# Patient Record
Sex: Female | Born: 1995 | Race: Black or African American | Hispanic: No | Marital: Single | Smoking: Never smoker
Health system: Southern US, Community
[De-identification: ages and names within clinical notes are randomized; demographics above are authoritative.]

## PROBLEM LIST (undated history)

## (undated) DIAGNOSIS — F419 Anxiety disorder, unspecified: Secondary | ICD-10-CM

## (undated) DIAGNOSIS — F32A Depression, unspecified: Secondary | ICD-10-CM

## (undated) DIAGNOSIS — F329 Major depressive disorder, single episode, unspecified: Secondary | ICD-10-CM

## (undated) DIAGNOSIS — Z789 Other specified health status: Secondary | ICD-10-CM

---

## 2016-09-18 ENCOUNTER — Emergency Department (HOSPITAL_COMMUNITY)
Admission: EM | Admit: 2016-09-18 | Discharge: 2016-09-19 | Disposition: A | Discharge status: Other Acute Inpatient Hospital | Attending: Emergency Medicine | Admitting: Emergency Medicine

## 2016-09-18 ENCOUNTER — Encounter (HOSPITAL_COMMUNITY): Payer: Self-pay | Admitting: Emergency Medicine

## 2016-09-18 DIAGNOSIS — Y939 Activity, unspecified: Secondary | ICD-10-CM | POA: Insufficient documentation

## 2016-09-18 DIAGNOSIS — Z5181 Encounter for therapeutic drug level monitoring: Secondary | ICD-10-CM | POA: Insufficient documentation

## 2016-09-18 DIAGNOSIS — R45851 Suicidal ideations: Secondary | ICD-10-CM

## 2016-09-18 DIAGNOSIS — Y929 Unspecified place or not applicable: Secondary | ICD-10-CM | POA: Insufficient documentation

## 2016-09-18 DIAGNOSIS — S80812A Abrasion, left lower leg, initial encounter: Secondary | ICD-10-CM | POA: Insufficient documentation

## 2016-09-18 DIAGNOSIS — F411 Generalized anxiety disorder: Secondary | ICD-10-CM

## 2016-09-18 DIAGNOSIS — S80811A Abrasion, right lower leg, initial encounter: Secondary | ICD-10-CM | POA: Insufficient documentation

## 2016-09-18 DIAGNOSIS — Y999 Unspecified external cause status: Secondary | ICD-10-CM | POA: Insufficient documentation

## 2016-09-18 DIAGNOSIS — Z23 Encounter for immunization: Secondary | ICD-10-CM | POA: Insufficient documentation

## 2016-09-18 DIAGNOSIS — S60511A Abrasion of right hand, initial encounter: Secondary | ICD-10-CM | POA: Insufficient documentation

## 2016-09-18 DIAGNOSIS — Z7289 Other problems related to lifestyle: Secondary | ICD-10-CM

## 2016-09-18 DIAGNOSIS — T07XXXA Unspecified multiple injuries, initial encounter: Secondary | ICD-10-CM

## 2016-09-18 DIAGNOSIS — F322 Major depressive disorder, single episode, severe without psychotic features: Secondary | ICD-10-CM | POA: Diagnosis present

## 2016-09-18 DIAGNOSIS — S60512A Abrasion of left hand, initial encounter: Secondary | ICD-10-CM | POA: Insufficient documentation

## 2016-09-18 DIAGNOSIS — F419 Anxiety disorder, unspecified: Secondary | ICD-10-CM | POA: Insufficient documentation

## 2016-09-18 DIAGNOSIS — X788XXA Intentional self-harm by other sharp object, initial encounter: Secondary | ICD-10-CM | POA: Insufficient documentation

## 2016-09-18 LAB — RAPID URINE DRUG SCREEN, HOSP PERFORMED
Amphetamines: NOT DETECTED
BENZODIAZEPINES: NOT DETECTED
Barbiturates: NOT DETECTED
COCAINE: NOT DETECTED
OPIATES: NOT DETECTED
Tetrahydrocannabinol: NOT DETECTED

## 2016-09-18 LAB — CBC
HCT: 37.6 % (ref 36.0–46.0)
Hemoglobin: 13.3 g/dL (ref 12.0–15.0)
MCH: 32 pg (ref 26.0–34.0)
MCHC: 35.4 g/dL (ref 30.0–36.0)
MCV: 90.4 fL (ref 78.0–100.0)
PLATELETS: 294 10*3/uL (ref 150–400)
RBC: 4.16 MIL/uL (ref 3.87–5.11)
RDW: 12.6 % (ref 11.5–15.5)
WBC: 11.4 10*3/uL — AB (ref 4.0–10.5)

## 2016-09-18 LAB — COMPREHENSIVE METABOLIC PANEL
ALBUMIN: 4.7 g/dL (ref 3.5–5.0)
ALK PHOS: 79 U/L (ref 38–126)
ALT: 20 U/L (ref 14–54)
ANION GAP: 10 (ref 5–15)
AST: 26 U/L (ref 15–41)
BILIRUBIN TOTAL: 0.7 mg/dL (ref 0.3–1.2)
BUN: 10 mg/dL (ref 6–20)
CALCIUM: 9.8 mg/dL (ref 8.9–10.3)
CO2: 25 mmol/L (ref 22–32)
Chloride: 106 mmol/L (ref 101–111)
Creatinine, Ser: 0.64 mg/dL (ref 0.44–1.00)
GFR calc non Af Amer: >60 mL/min (ref >60)
Glucose, Bld: 84 mg/dL (ref 65–99)
POTASSIUM: 3.7 mmol/L (ref 3.5–5.1)
SODIUM: 141 mmol/L (ref 135–145)
TOTAL PROTEIN: 7.9 g/dL (ref 6.5–8.1)

## 2016-09-18 LAB — I-STAT BETA HCG BLOOD, ED (MC, WL, AP ONLY): I-stat hCG, quantitative: <5 m[IU]/mL (ref <5)

## 2016-09-18 LAB — ACETAMINOPHEN LEVEL

## 2016-09-18 LAB — SALICYLATE LEVEL

## 2016-09-18 LAB — ETHANOL: Alcohol, Ethyl (B): <5 mg/dL (ref <5)

## 2016-09-18 MED ORDER — ACETAMINOPHEN 325 MG PO TABS: 650.0000 mg | ORAL_TABLET | ORAL | Status: DC | PRN

## 2016-09-18 MED ORDER — ONDANSETRON HCL 4 MG PO TABS: 4.0000 mg | ORAL_TABLET | Freq: Three times a day (TID) | ORAL | Status: DC | PRN

## 2016-09-18 MED ORDER — ALUM & MAG HYDROXIDE-SIMETH 200-200-20 MG/5ML PO SUSP: 30.0000 mL | ORAL | Status: DC | PRN

## 2016-09-18 MED ORDER — ZOLPIDEM TARTRATE 5 MG PO TABS
5.0000 mg | ORAL_TABLET | Freq: Every evening | ORAL | Status: DC | PRN
  Administered 2016-09-18: 5 mg via ORAL
  Filled 2016-09-18: qty 1

## 2016-09-18 MED ORDER — BACITRACIN ZINC 500 UNIT/GM EX OINT
TOPICAL_OINTMENT | CUTANEOUS | Status: DC | PRN
  Administered 2016-09-18: 1 via TOPICAL
  Filled 2016-09-18: qty 28.35

## 2016-09-18 MED ORDER — TETANUS-DIPHTH-ACELL PERTUSSIS 5-2.5-18.5 LF-MCG/0.5 IM SUSP
0.5000 mL | Freq: Once | INTRAMUSCULAR | Status: DC
  Filled 2016-09-18: qty 0.5

## 2016-09-18 MED ORDER — LORAZEPAM 1 MG PO TABS
1.0000 mg | ORAL_TABLET | Freq: Three times a day (TID) | ORAL | Status: DC | PRN
  Administered 2016-09-18: 1 mg via ORAL
  Filled 2016-09-18: qty 1

## 2016-09-18 MED ORDER — LORAZEPAM 1 MG PO TABS: 1.0000 mg | ORAL_TABLET | Freq: Once | ORAL | Status: DC

## 2016-09-18 MED ORDER — IBUPROFEN 200 MG PO TABS: 600.0000 mg | ORAL_TABLET | Freq: Three times a day (TID) | ORAL | Status: DC | PRN

## 2016-09-18 NOTE — ED Provider Notes (Signed)
[facility]-EMERGENCY DEPT Provider Note   CSN: [id number] Arrival date & time: 09/18/16  1809     History   Chief Complaint Chief Complaint  Patient presents with  . Suicidal    HPI Carrie Fischer is a 20 y.o. otherwise healthy female, who presents to the ED with complaints of worsening anxiety and suicidal ideations with a plan of slitting her wrists with a razor. Patient admits that she self cuts usually to help soothe her anxieties/etc, but last night she states she did it in an attempt to kill herself. She reports that the counselor at [facility] today advised her to come here for ongoing psychiatric care. She states that she's been very anxious since coming to college, and school seems to have aggravated her psychiatric issues. She denies HI/AVH, alcohol use, drug use, or smoking. She has no medical problems, takes no medications, and has no known drug allergies. She is not sure when her last tetanus was. Denies any erythema, warmth, swelling, or drainage from the wounds. Denies any other medical complaints at this time. Here voluntarily. LMP 3 days ago.   The history is provided by the patient and medical records. No language interpreter was used.  Mental Health Problem  Presenting symptoms: depression, self-mutilation, suicidal thoughts and suicide attempt   Presenting symptoms: no hallucinations and no homicidal ideas   Onset quality:  Gradual Timing:  Constant Progression:  Worsening Chronicity:  Recurrent Context: stressful life event (school)   Treatment compliance:  Untreated Relieved by:  None tried Exacerbated by: school. Ineffective treatments:  None tried Associated symptoms: anxiety   Associated symptoms: no abdominal pain and no chest pain     History reviewed. No pertinent past medical history.  There are no active problems to display for this patient.   History reviewed. No pertinent surgical history.  OB History    No data available       Home Medications      Prior to Admission medications   Not on File    Family History History reviewed. No pertinent family history.  Social History Social History  Substance Use Topics  . Smoking status: Never Smoker  . Smokeless tobacco: Never Used  . Alcohol use No     Allergies   Patient has no known allergies.   Review of Systems Review of Systems  Constitutional: Negative for chills and fever.  Respiratory: Negative for shortness of breath.   Cardiovascular: Negative for chest pain.  Gastrointestinal: Negative for abdominal pain, constipation, diarrhea, nausea and vomiting.  Genitourinary: Negative for dysuria and hematuria.  Musculoskeletal: Negative for arthralgias, joint swelling and myalgias.  Skin: Positive for wound (self-cuts arms and legs, no warmth/erythema/swelling/drainage). Negative for color change.  Allergic/Immunologic: Negative for immunocompromised state.  Neurological: Negative for weakness and numbness.  Psychiatric/Behavioral: Positive for self-injury and suicidal ideas. Negative for confusion, hallucinations and homicidal ideas. The patient is nervous/anxious.    10 Systems reviewed and are negative for acute change except as noted in the HPI.   Physical Exam Updated Vital Signs BP 126/86 (BP Location: Left Arm)   Pulse 95   Temp 97.5 F (36.4 C) (Oral)   Resp 16   SpO2 97%   Physical Exam  Constitutional: She is oriented to person, place, and time. Vital signs are normal. She appears well-developed and well-nourished.  Non-toxic appearance. No distress.  Afebrile, nontoxic, NAD  HENT:  Head: Normocephalic and atraumatic.  Mouth/Throat: Oropharynx is clear and moist and mucous membranes are normal.  Eyes: Conjunctivae  and EOM are normal. Right eye exhibits no discharge. Left eye exhibits no discharge.  Neck: Normal range of motion. Neck supple.  Cardiovascular: Normal rate, regular rhythm, normal heart sounds and intact distal pulses.  Exam reveals no  gallop and no friction rub.   No murmur heard. Pulmonary/Chest: Effort normal and breath sounds normal. No respiratory distress. She has no decreased breath sounds. She has no wheezes. She has no rhonchi. She has no rales.  Abdominal: Soft. Normal appearance and bowel sounds are normal. She exhibits no distension. There is no tenderness. There is no rigidity, no rebound, no guarding, no CVA tenderness, no tenderness at McBurney's point and negative Murphy's sign.  Musculoskeletal: Normal range of motion.  Neurological: She is alert and oriented to person, place, and time. She has normal strength. No sensory deficit.  Skin: Skin is warm and dry. Abrasion noted. No rash noted. No erythema.  Multiple linear abrasions to b/l upper and lower extremities, no ongoing bleeding, no surrounding cellulitis, no drainage/warmth/erythema.   Psychiatric: Her speech is normal and behavior is normal. She is not actively hallucinating. She exhibits a depressed mood. She expresses suicidal ideation. She expresses no homicidal ideation. She expresses suicidal plans. She expresses no homicidal plans.  Depressed affect, endorsing SI with plan, denies AVH/HI  Nursing note and vitals reviewed.    ED Treatments / Results  Labs (all labs ordered are listed, but only abnormal results are displayed) Labs Reviewed  ACETAMINOPHEN LEVEL - Abnormal; Notable for the following:       Result Value   Acetaminophen (Tylenol), Serum <10 (*)    All other components within normal limits  CBC - Abnormal; Notable for the following:    WBC 11.4 (*)    All other components within normal limits  COMPREHENSIVE METABOLIC PANEL  ETHANOL  SALICYLATE LEVEL  RAPID URINE DRUG SCREEN, HOSP PERFORMED  I-STAT BETA HCG BLOOD, ED ([facility], [facility], [facility] ONLY)    EKG  EKG Interpretation None       Radiology No results found.  Procedures Procedures (including critical care time)  Medications Ordered in ED Medications  Tdap (BOOSTRIX)  injection 0.5 mL (not administered)  alum & mag hydroxide-simeth (MAALOX/MYLANTA) 200-200-20 MG/5ML suspension 30 mL (not administered)  ondansetron (ZOFRAN) tablet 4 mg (not administered)  zolpidem (AMBIEN) tablet 5 mg (not administered)  ibuprofen (ADVIL,MOTRIN) tablet 600 mg (not administered)  acetaminophen (TYLENOL) tablet 650 mg (not administered)  LORazepam (ATIVAN) tablet 1 mg (not administered)     Initial Impression / Assessment and Plan / ED Course  I have reviewed the triage vital signs and the nursing notes.  Pertinent labs & imaging results that were available during my care of the patient were reviewed by me and considered in my medical decision making (see chart for details).  Clinical Course     20 y.o. female here with SI with a plan to cut herself, self-harms by cutting but states that last night it was more in a suicide attempt. Multiple abrasions on arms and legs bilaterally, no evidence of superimposed infection, no open or bleeding wounds, all very superficial and not requiring further emergent intervention. Will update Tdap. Denies other complaints, nonsmoker, no EtOH/Drug use, denies HI/AVH. Will get screening labs and then TTS consult. So far, UDS unremarkable and CBC with mildly elevated WBC 11.4 but otherwise unremarkable. Awaiting remaining labs.   7:59 PM EtOH undetectable. CMP WNL. Salicylate and acetaminophen levels WNL. HCG neg. Pt medically cleared at this time. Psych hold orders  and home med orders placed. Please see TTS notes for further documentation of care/dispo. PLEASE NOTE THAT PT IS HERE VOLUNTARILY AT THIS TIME, IF PT TRIES TO LEAVE THEY WOULD NEED IVC PAPERWORK TAKEN OUT. Pt stable at time of med clearance.    Final Clinical Impressions(s) / ED Diagnoses   Final diagnoses:  Suicidal ideation  Anxiety  Deliberate self-cutting  Abrasions of multiple sites    New Prescriptions New Prescriptions   No medications on file     Allen DerryMercedes  Camprubi-Soms, PA-C 09/18/16 1959    Linwood DibblesJon Knapp, MD 09/20/16 1610
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
## 2016-09-18 NOTE — ED Notes (Signed)
Bed: WA31 Expected date:  Expected time:  Means of arrival:  Comments: Hold for triage 4 

## 2016-09-18 NOTE — ED Triage Notes (Signed)
Pt brought by campus counseling for suicidal ideation with plan for cutting self, depression, anxiety, with suicide attempt today at 0300 by cutting self. Many shallow, long, crusted excoriations to bilateral arms and legs. No bleeding, swelling, erythema, heat, drainage. Treated for depression in past. No HI/AVH.

## 2016-09-18 NOTE — BH Assessment (Addendum)
TTS interview has been completed. C Pt became observantly anxious during assessment. Pt began shaking and muscles became tense. Clinician suspected panic attack onset. Clinician engage pt in series of breathing and relaxation interventions. Pt anxiety level decreased and pt verbalized that the hospital was traumatic to her and she is unwilling to remain in ED. Clinician provided supportive counseling and psycho education.   Clinician advised Marchelle FolksAmanda, RN of pt presentation.   Clinician consulted with Donell SievertSpencer Simon, PA and pt is recommended for inpatient admission. Clinician informed pt RN and EDP Dr.Knapp of pt disposition. Clinician also communicated IVC recommendation.  Clinician confirmed lack of [facility] bed availability with Clint Bolderori Beck, AC. TTS to seek placement.

## 2016-09-18 NOTE — ED Notes (Signed)
Patient is A&O x4. Patient presents depressed. Patient states that she cuts herself and that she was having suicidal thoughts. 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 Patient currently denies SI/HI and AVH. Patient has bilateral cuts all over her lower arms and legs. MD notified for bacitracin order and applied to patients wounds. Patient rates anxiety as 8/10. Safety check performed. Patient placed on Q15 minute safety checks. Will continue to monitor.

## 2016-09-18 NOTE — ED Notes (Addendum)
Patient resting. Patient states anxiety is now 0/10. Will continue to monitor.

## 2016-09-18 NOTE — ED Notes (Signed)
TTS reported patient became very anxious during consult. Patient made phone call and became anxious on the phone. PRN ativan given as prescribed. Patient requested something for sleep saying "I can't sleep here". PRN Ambien given as prescribed. Will continue to monitor and reassess.

## 2016-09-19 ENCOUNTER — Encounter (HOSPITAL_COMMUNITY): Payer: Self-pay

## 2016-09-19 ENCOUNTER — Inpatient Hospital Stay (HOSPITAL_COMMUNITY)
Admission: AD | Admit: 2016-09-19 | Discharge: 2016-09-24 | DRG: 885 | Disposition: A | Discharge status: Home / Self Care | Source: Intra-hospital | Attending: Psychiatry | Admitting: Psychiatry

## 2016-09-19 DIAGNOSIS — G4700 Insomnia, unspecified: Secondary | ICD-10-CM | POA: Diagnosis present

## 2016-09-19 DIAGNOSIS — F410 Panic disorder [episodic paroxysmal anxiety] without agoraphobia: Secondary | ICD-10-CM | POA: Diagnosis present

## 2016-09-19 DIAGNOSIS — F332 Major depressive disorder, recurrent severe without psychotic features: Secondary | ICD-10-CM | POA: Diagnosis present

## 2016-09-19 DIAGNOSIS — F411 Generalized anxiety disorder: Secondary | ICD-10-CM | POA: Diagnosis present

## 2016-09-19 DIAGNOSIS — Z915 Personal history of self-harm: Secondary | ICD-10-CM | POA: Diagnosis not present

## 2016-09-19 DIAGNOSIS — S40819A Abrasion of unspecified upper arm, initial encounter: Secondary | ICD-10-CM | POA: Diagnosis present

## 2016-09-19 DIAGNOSIS — R45851 Suicidal ideations: Secondary | ICD-10-CM | POA: Diagnosis not present

## 2016-09-19 DIAGNOSIS — Z23 Encounter for immunization: Secondary | ICD-10-CM | POA: Diagnosis not present

## 2016-09-19 DIAGNOSIS — F322 Major depressive disorder, single episode, severe without psychotic features: Secondary | ICD-10-CM | POA: Diagnosis present

## 2016-09-19 DIAGNOSIS — F4000 Agoraphobia, unspecified: Secondary | ICD-10-CM | POA: Diagnosis present

## 2016-09-19 DIAGNOSIS — F4010 Social phobia, unspecified: Secondary | ICD-10-CM | POA: Diagnosis present

## 2016-09-19 DIAGNOSIS — X789XXA Intentional self-harm by unspecified sharp object, initial encounter: Secondary | ICD-10-CM | POA: Diagnosis present

## 2016-09-19 DIAGNOSIS — Z79899 Other long term (current) drug therapy: Secondary | ICD-10-CM | POA: Diagnosis not present

## 2016-09-19 DIAGNOSIS — Z818 Family history of other mental and behavioral disorders: Secondary | ICD-10-CM

## 2016-09-19 DIAGNOSIS — R12 Heartburn: Secondary | ICD-10-CM | POA: Diagnosis present

## 2016-09-19 HISTORY — DX: Other specified health status: Z78.9

## 2016-09-19 MED ORDER — FLUOXETINE HCL 10 MG PO CAPS
10.0000 mg | ORAL_CAPSULE | Freq: Every day | ORAL | Status: DC
Start: 2016-09-19 — End: 2016-09-19
  Administered 2016-09-19: 10 mg via ORAL
  Filled 2016-09-19: qty 1

## 2016-09-19 MED ORDER — ACETAMINOPHEN 325 MG PO TABS
650.0000 mg | ORAL_TABLET | ORAL | Status: DC | PRN
Start: 2016-09-19 — End: 2016-09-24

## 2016-09-19 MED ORDER — MAGNESIUM HYDROXIDE 400 MG/5ML PO SUSP: 30.0000 mL | Freq: Every day | ORAL | Status: DC | PRN

## 2016-09-19 MED ORDER — IBUPROFEN 600 MG PO TABS: 600.0000 mg | ORAL_TABLET | Freq: Three times a day (TID) | ORAL | Status: DC | PRN

## 2016-09-19 MED ORDER — ONDANSETRON HCL 4 MG PO TABS: 4.0000 mg | ORAL_TABLET | Freq: Three times a day (TID) | ORAL | Status: DC | PRN

## 2016-09-19 MED ORDER — FLUOXETINE HCL 10 MG PO CAPS
10.0000 mg | ORAL_CAPSULE | Freq: Every day | ORAL | Status: DC
  Administered 2016-09-20 – 2016-09-21 (×2): 10 mg via ORAL
  Filled 2016-09-19 (×4): qty 1

## 2016-09-19 MED ORDER — HYDROXYZINE HCL 25 MG PO TABS: 25.0000 mg | ORAL_TABLET | Freq: Three times a day (TID) | ORAL | Status: DC | PRN

## 2016-09-19 MED ORDER — ALUM & MAG HYDROXIDE-SIMETH 200-200-20 MG/5ML PO SUSP: 30.0000 mL | ORAL | Status: DC | PRN

## 2016-09-19 MED ORDER — INFLUENZA VAC SPLIT QUAD 0.5 ML IM SUSY
0.5000 mL | PREFILLED_SYRINGE | INTRAMUSCULAR | Status: AC
  Administered 2016-09-20: 0.5 mL via INTRAMUSCULAR
  Filled 2016-09-19: qty 0.5

## 2016-09-19 MED ORDER — HYDROXYZINE HCL 25 MG PO TABS
25.0000 mg | ORAL_TABLET | Freq: Three times a day (TID) | ORAL | Status: DC | PRN
Start: 2016-09-19 — End: 2016-09-20
  Administered 2016-09-19 – 2016-09-20 (×2): 25 mg via ORAL
  Filled 2016-09-19 (×2): qty 1

## 2016-09-19 MED ORDER — BACITRACIN ZINC 500 UNIT/GM EX OINT
TOPICAL_OINTMENT | CUTANEOUS | Status: DC | PRN
  Administered 2016-09-19: 22:00:00 via TOPICAL
  Filled 2016-09-19 (×2): qty 28.35

## 2016-09-19 MED ORDER — TRAZODONE HCL 50 MG PO TABS
50.0000 mg | ORAL_TABLET | Freq: Every evening | ORAL | Status: DC | PRN
  Administered 2016-09-19 – 2016-09-23 (×5): 50 mg via ORAL
  Filled 2016-09-19 (×13): qty 1

## 2016-09-19 NOTE — Progress Notes (Signed)
Patient presents with sad and depressed affect and behavior during admission interview and assessment. VS monitored and recorded. Skin check performed with Otto Herbreka RN and revealed superficial cuts to arms and legs. Contraband was not found. Patient was oriented to unit and schedule. Pt states "I am here because I need to get better at dealing with stress". Pt denies SI/HI/AVH at this time. PO fluids provided. Safety maintained. Rest encouraged.

## 2016-09-19 NOTE — BH Assessment (Signed)
Per Dr. Jannifer FranklinAkintayo and Nanine MeansJamison Lord, DNP, patient meets criteria for INPT treatment. [facility] Support paperwork completed. Nursing report [phone]. [facility] [place] [facility].

## 2016-09-19 NOTE — ED Notes (Signed)
[facility]. All belongings returned to pt who signed for same. Pt calm and cooperative.

## 2016-09-19 NOTE — Plan of Care (Signed)
Problem: Education: Goal: Knowledge of the prescribed therapeutic regimen will improve Outcome: Completed/Met Date Met: 09/19/16 Completes

## 2016-09-19 NOTE — Consult Note (Signed)
[facility] Face-to-Face Psychiatry Consult   Reason for Consult:  Anxiety, depression, suicidal Referring Physician:  EDP Patient Identification: Carrie Fischer MRN:  [id number] Principal Diagnosis: Severe major depression without psychotic features [facility] Diagnosis:   Patient Active Problem List   Diagnosis Date Noted  . Generalized anxiety disorder [F41.1] 09/19/2016    Priority: High  . Severe major depression without psychotic features ([place]) [F32.2] 09/19/2016    Priority: High    Total Time spent with patient: 45 minutes  Subjective:   Carrie Fischer is a 20 y.o. female patient admitted after suicide attempt.  HPI:  Carrie Fischer is an 20 y.o. female presenting to ED voluntarily for evaluation. 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 Patient reports increasing anxiety, depressive symptoms and recurrent suicidal thoughts. Patient reports history of suicide attempt by cutting her arms, wrist and legs. She attempted suicide yesterday by cutting. Current stressors include but not limited to being overwhelmed by her school curriculum and difficulty transition to a college life. Patient denies drugs and alcohol abuse but unable to contract for safety.   Past Psychiatric History: as above  Risk to Self: Suicidal Ideation: Yes-Currently Present Suicidal Intent: Yes-Currently Present Is patient at risk for suicide?: Yes Suicidal Plan?: Yes-Currently Present Specify Current Suicidal Plan: pt cut wrist in attempts of committing suicide Access to Means: Yes Specify Access to Suicidal Means: Access to sharp objects What has been your use of drugs/alcohol within the last 12 months?: Pt denis use of drugs/alcohol How many times?: 1 ([age]yo) Other Self Harm Risks: self harm, limited supports, no current outpatient providers Triggers for Past Attempts: Unknown Intentional Self Injurious Behavior: Cutting Comment - Self Injurious Behavior: onset 8th  grade, last cut 11.13.17 Risk to Others: Homicidal Ideation: No Thoughts of Harm to Others: No Current Homicidal Intent: No Current Homicidal Plan: No Access to Homicidal Means: No History of harm to others?: No Assessment of Violence: None Noted Does patient have access to weapons?: No Criminal Charges Pending?: No Does patient have a court date: No Prior Inpatient Therapy: Prior Inpatient Therapy: No Prior Outpatient Therapy: Prior Outpatient Therapy: Yes Prior Therapy Dates: "I been in and out of therapy all my life"-pt refused to continue OPT during high school and has not received treatment since Prior Therapy Facilty/Provider(s): Not Reported Reason for Treatment: Depression, anxiety Does patient have an ACCT team?: No Does patient have Intensive In-House Services?  : No Does patient have [facility] services? : No Does patient have P4CC services?: No  Past Medical History: History reviewed. No pertinent past medical history. History reviewed. No pertinent surgical history. Family History: History reviewed. No pertinent family history. Family Psychiatric  History: Social History:  History  Alcohol Use No     History  Drug Use No    Social History   Social History  . Marital status: Single    Spouse name: N/A  . Number of children: N/A  . Years of education: N/A   Social History Main Topics  . Smoking status: Never Smoker  . Smokeless tobacco: Never Used  . Alcohol use No  . Drug use: No  . Sexual activity: Not Asked   Other Topics Concern  . None   Social History Narrative  . None   Additional Social History:    Allergies:  No Known Allergies  Labs:  Results for orders placed or performed during the hospital encounter of 09/18/16 (from the past 48 hour(s))  Rapid urine drug screen (  hospital performed)     Status: None   Collection Time: 09/18/16  6:30 PM  Result Value Ref Range   Opiates NONE DETECTED NONE DETECTED   Cocaine NONE DETECTED NONE DETECTED    Benzodiazepines NONE DETECTED NONE DETECTED   Amphetamines NONE DETECTED NONE DETECTED   Tetrahydrocannabinol NONE DETECTED NONE DETECTED   Barbiturates NONE DETECTED NONE DETECTED    Comment:        DRUG SCREEN FOR MEDICAL PURPOSES ONLY.  IF CONFIRMATION IS NEEDED FOR ANY PURPOSE, NOTIFY LAB WITHIN 5 DAYS.        LOWEST DETECTABLE LIMITS FOR URINE DRUG SCREEN Drug Class       Cutoff (ng/mL) Amphetamine      1000 Barbiturate      200 Benzodiazepine   154 Tricyclics       008 Opiates          300 Cocaine          300 THC              50   Comprehensive metabolic panel     Status: None   Collection Time: 09/18/16  7:01 PM  Result Value Ref Range   Sodium 141 135 - 145 mmol/L   Potassium 3.7 3.5 - 5.1 mmol/L   Chloride 106 101 - 111 mmol/L   CO2 25 22 - 32 mmol/L   Glucose, Bld 84 65 - 99 mg/dL   BUN 10 6 - 20 mg/dL   Creatinine, Ser 0.64 0.44 - 1.00 mg/dL   Calcium 9.8 8.9 - 10.3 mg/dL   Total Protein 7.9 6.5 - 8.1 g/dL   Albumin 4.7 3.5 - 5.0 g/dL   AST 26 15 - 41 U/L   ALT 20 14 - 54 U/L   Alkaline Phosphatase 79 38 - 126 U/L   Total Bilirubin 0.7 0.3 - 1.2 mg/dL   GFR calc non Af Amer >60 >60 mL/min   GFR calc Af Amer >60 >60 mL/min    Comment: (NOTE) The eGFR has been calculated using the CKD EPI equation. This calculation has not been validated in all clinical situations. eGFR's persistently <60 mL/min signify possible Chronic Kidney Disease.    Anion gap 10 5 - 15  Ethanol     Status: None   Collection Time: 09/18/16  7:01 PM  Result Value Ref Range   Alcohol, Ethyl (B) <5 <5 mg/dL    Comment:        LOWEST DETECTABLE LIMIT FOR SERUM ALCOHOL IS 5 mg/dL FOR MEDICAL PURPOSES ONLY   Salicylate level     Status: None   Collection Time: 09/18/16  7:01 PM  Result Value Ref Range   Salicylate Lvl <6.7 2.8 - 30.0 mg/dL  Acetaminophen level     Status: Abnormal   Collection Time: 09/18/16  7:01 PM  Result Value Ref Range   Acetaminophen (Tylenol), Serum  <10 (L) 10 - 30 ug/mL    Comment:        THERAPEUTIC CONCENTRATIONS VARY SIGNIFICANTLY. A RANGE OF 10-30 ug/mL MAY BE AN EFFECTIVE CONCENTRATION FOR MANY PATIENTS. HOWEVER, SOME ARE BEST TREATED AT CONCENTRATIONS OUTSIDE THIS RANGE. ACETAMINOPHEN CONCENTRATIONS >150 ug/mL AT 4 HOURS AFTER INGESTION AND >50 ug/mL AT 12 HOURS AFTER INGESTION ARE OFTEN ASSOCIATED WITH TOXIC REACTIONS.   cbc     Status: Abnormal   Collection Time: 09/18/16  7:01 PM  Result Value Ref Range   WBC 11.4 (H) 4.0 - 10.5 K/uL   RBC 4.16 3.87 -  5.11 MIL/uL   Hemoglobin 13.3 12.0 - 15.0 g/dL   HCT 37.6 36.0 - 46.0 %   MCV 90.4 78.0 - 100.0 fL   MCH 32.0 26.0 - 34.0 pg   MCHC 35.4 30.0 - 36.0 g/dL   RDW 12.6 11.5 - 15.5 %   Platelets 294 150 - 400 K/uL  I-Stat beta hCG blood, ED     Status: None   Collection Time: 09/18/16  7:40 PM  Result Value Ref Range   I-stat hCG, quantitative <5.0 <5 mIU/mL   Comment 3            Comment:   GEST. AGE      CONC.  (mIU/mL)   <=1 WEEK        5 - 50     2 WEEKS       50 - 500     3 WEEKS       100 - 10,000     4 WEEKS     1,000 - 30,000        FEMALE AND NON-PREGNANT FEMALE:     LESS THAN 5 mIU/mL     Current Facility-Administered Medications  Medication Dose Route Frequency Provider Last Rate Last Dose  . acetaminophen (TYLENOL) tablet 650 mg  650 mg Oral Q4H PRN Mercedes Camprubi-Soms, PA-C      . alum & mag hydroxide-simeth (MAALOX/MYLANTA) 200-200-20 MG/5ML suspension 30 mL  30 mL Oral PRN Mercedes Camprubi-Soms, PA-C      . bacitracin ointment   Topical PRN Courteney Lyn Mackuen, MD   1 application at 38/45/36 2151  . FLUoxetine (PROZAC) capsule 10 mg  10 mg Oral Daily Ravonda Brecheen, MD      . hydrOXYzine (ATARAX/VISTARIL) tablet 25 mg  25 mg Oral TID PRN Corena Pilgrim, MD      . ibuprofen (ADVIL,MOTRIN) tablet 600 mg  600 mg Oral Q8H PRN Mercedes Camprubi-Soms, PA-C      . ondansetron (ZOFRAN) tablet 4 mg  4 mg Oral Q8H PRN Mercedes Camprubi-Soms, PA-C       . Tdap (BOOSTRIX) injection 0.5 mL  0.5 mL Intramuscular Once Mercedes Camprubi-Soms, PA-C      . zolpidem (AMBIEN) tablet 5 mg  5 mg Oral QHS PRN Mercedes Camprubi-Soms, PA-C   5 mg at 09/18/16 2243   Current Outpatient Prescriptions  Medication Sig Dispense Refill  . Multiple Vitamin (MULTIVITAMIN WITH MINERALS) TABS tablet Take 1 tablet by mouth daily.    . naproxen sodium (ANAPROX) 220 MG tablet Take 440 mg by mouth every 12 (twelve) hours as needed (for pain).      Musculoskeletal: Strength & Muscle Tone: within normal limits Gait & Station: normal Patient leans: N/A  Psychiatric Specialty Exam: Physical Exam  Psychiatric: Her speech is normal. Her mood appears anxious. She is slowed and withdrawn. Cognition and memory are normal. She expresses impulsivity. She exhibits a depressed mood. She expresses suicidal ideation. She expresses suicidal plans.    Review of Systems  Constitutional: Negative.   HENT: Negative.   Eyes: Negative.   Respiratory: Negative.   Cardiovascular: Negative.   Gastrointestinal: Negative.   Genitourinary: Negative.   Musculoskeletal: Negative.   Skin: Negative.   Neurological: Negative.   Endo/Heme/Allergies: Negative.   Psychiatric/Behavioral: Positive for depression and suicidal ideas. The patient is nervous/anxious and has insomnia.     Blood pressure 116/81, pulse 104, temperature 98 F (36.7 C), temperature source Oral, resp. rate 20, SpO2 100 %.There is no height or weight  on file to calculate BMI.  General Appearance: Casual  Eye Contact:  Minimal  Speech:  Clear and Coherent  Volume:  Decreased  Mood:  Anxious, Depressed, Dysphoric and Hopeless  Affect:  Depressed  Thought Process:  Coherent and Descriptions of Associations: Intact  Orientation:  Full (Time, Place, and Person)  Thought Content:  Logical  Suicidal Thoughts:  Yes.  with intent/plan  Homicidal Thoughts:  No  Memory:  Immediate;   Good Recent;   Good Remote;    Good  Judgement:  Poor  Insight:  Lacking  Psychomotor Activity:  Psychomotor Retardation  Concentration:  Concentration: Fair and Attention Span: Fair  Recall:  Good  Fund of Knowledge:  Good  Language:  Good  Akathisia:  No  Handed:  Right  AIMS (if indicated):     Assets:  Communication Skills Desire for Improvement Physical Health  ADL's:  Intact  Cognition:  WNL  Sleep:   poor     Treatment Plan Summary: Daily contact with patient to assess and evaluate symptoms and progress in treatment, Medication management. Start Prozac 10 mg daily for depression/anxiety.  Disposition: Recommend psychiatric Inpatient admission when medically cleared. Supportive therapy provided about ongoing stressors.  Corena Pilgrim, MD 09/19/2016 10:43 AM

## 2016-09-19 NOTE — ED Notes (Signed)
Affect flat and mood depressed. Pt denied SI/HI at this time, and did not want to be admitted to a hospital. When told that she would be admitted, she cried for about 30 minutes.

## 2016-09-19 NOTE — Tx Team (Signed)
Initial Treatment Plan 09/19/2016 4:59 PM Carrie Fischer ZOX:[id number]    PATIENT STRESSORS: Financial difficulties Loss of family member Marital or family conflict   PATIENT STRENGTHS: Ability for insight Average or above average intelligence Capable of independent living Communication skills Motivation for treatment/growth Special hobby/interest Supportive family/friends   PATIENT IDENTIFIED PROBLEMS:  "I need to learn new ways to cope with problems"   "I need coping skills for depression"   "I need to learn how not cut myself"                 DISCHARGE CRITERIA:  Ability to meet basic life and health needs Adequate post-discharge living arrangements Improved stabilization in mood, thinking, and/or behavior Reduction of life-threatening or endangering symptoms to within safe limits Safe-care adequate arrangements made  PRELIMINARY DISCHARGE PLAN: Outpatient therapy Return to previous living arrangement Return to previous work or school arrangements  PATIENT/FAMILY INVOLVEMENT: This treatment plan has been presented to and reviewed with the patient, Carrie Fischer.  The patient and family have been given the opportunity to ask questions and make suggestions.  Jonetta SpeakAshton E Hanan Moen, RN 09/19/2016, 4:59 PM

## 2016-09-20 DIAGNOSIS — F332 Major depressive disorder, recurrent severe without psychotic features: Principal | ICD-10-CM

## 2016-09-20 DIAGNOSIS — F411 Generalized anxiety disorder: Secondary | ICD-10-CM

## 2016-09-20 DIAGNOSIS — Z79899 Other long term (current) drug therapy: Secondary | ICD-10-CM

## 2016-09-20 LAB — PREGNANCY, URINE: PREG TEST UR: NEGATIVE

## 2016-09-20 MED ORDER — LORAZEPAM 0.5 MG PO TABS
0.5000 mg | ORAL_TABLET | Freq: Four times a day (QID) | ORAL | Status: DC | PRN
  Administered 2016-09-21 – 2016-09-24 (×6): 0.5 mg via ORAL
  Filled 2016-09-20 (×7): qty 1

## 2016-09-20 NOTE — [facility] Group Notes (Signed)
[facility] Group Notes:  Goals and Aromatherapy  Date:  09/20/2016  Time:  5:00 PM  Type of Therapy:  Psychoeducational Skills  Participation Level:  Active  Participation Quality:  Appropriate and Attentive  Affect:  Anxious  Cognitive:  Alert and Appropriate  Insight:  Improving  Engagement in Group:  Engaged  Modes of Intervention:  Activity, Discussion and Education  Summary of Progress/Problems: Patient came to group and was engaged. She participated in aromatherapy and reported her goal is to work on her anxiety.  Rance Smithson E 09/20/2016, 5:00 PM

## 2016-09-20 NOTE — [facility] Group Notes (Signed)
[facility] 09/20/2016 1:15pm  Type of Therapy: Mental Health Association Presentation  Participation Level: Active  Participation Quality: Attentive  Affect: Appropriate  Cognitive: Oriented  Insight: Developing/Improving  Engagement in Therapy: Engaged  Modes of Intervention: Discussion, Education and Socialization  Summary of Progress/Problems: Mental Health Association (MHA) Speaker came to talk about his personal journey with substance abuse and addiction. The pt processed ways by which to relate to the speaker. MHA speaker provided handouts and educational information pertaining to groups and services offered by [facility]. Pt was engaged in speaker's presentation and was receptive to resources provided.    Vernie ShanksLauren Amarri Satterly, LCSW 09/20/2016 1:27 PM

## 2016-09-20 NOTE — Tx Team (Signed)
Interdisciplinary Treatment and Diagnostic Plan Update  09/20/2016 Time of Session: 12:48 PM  Carrie Fischer MRN: [id number]  Principal Diagnosis: Severe major depression without psychotic features (HCC)  Secondary Diagnoses: Principal Problem:   Severe major depression without psychotic features (HCC) Active Problems:   MDD (major depressive disorder), recurrent severe, without psychosis (HCC)   Current Medications:  Current Facility-Administered Medications  Medication Dose Route Frequency Provider Last Rate Last Dose  . acetaminophen (TYLENOL) tablet 650 mg  650 mg Oral Q4H PRN Charm Rings, NP      . alum & mag hydroxide-simeth (MAALOX/MYLANTA) 200-200-20 MG/5ML suspension 30 mL  30 mL Oral PRN Charm Rings, NP      . bacitracin ointment   Topical PRN Charm Rings, NP      . FLUoxetine (PROZAC) capsule 10 mg  10 mg Oral Daily Charm Rings, NP   10 mg at 09/20/16 0849  . ibuprofen (ADVIL,MOTRIN) tablet 600 mg  600 mg Oral Q8H PRN Charm Rings, NP      . LORazepam (ATIVAN) tablet 0.5 mg  0.5 mg Oral Q6H PRN Rockey Situ Cobos, MD      . magnesium hydroxide (MILK OF MAGNESIA) suspension 30 mL  30 mL Oral Daily PRN Charm Rings, NP      . ondansetron [facility]) tablet 4 mg  4 mg Oral Q8H PRN Charm Rings, NP      . traZODone (DESYREL) tablet 50 mg  50 mg Oral QHS,MR X 1 Kerry Hough, PA-C   50 mg at 09/19/16 2258    PTA Medications: Prescriptions Prior to Admission  Medication Sig Dispense Refill Last Dose  . Multiple Vitamin (MULTIVITAMIN WITH MINERALS) TABS tablet Take 1 tablet by mouth daily.   09/18/2016  . naproxen sodium (ANAPROX) 220 MG tablet Take 440 mg by mouth every 12 (twelve) hours as needed (for pain).   09/18/2016    Treatment Modalities: Medication Management, Group therapy, Case management,  1 to 1 session with clinician, Psychoeducation, Recreational therapy.  Patient Stressors: Financial difficulties Loss of family member Marital or family  conflict  Patient Strengths: Ability for insight Average or above average intelligence Capable of independent living Barrister's clerk for treatment/growth Special hobby/interest Supportive family/friends  Physician Treatment Plan for Primary Diagnosis: Severe major depression without psychotic features (HCC) Long Term Goal(s): Improvement in symptoms so as ready for discharge  Short Term Goals: Ability to verbalize feelings will improve Ability to disclose and discuss suicidal ideas Ability to demonstrate self-control will improve Ability to identify and develop effective coping behaviors will improve Ability to verbalize feelings will improve Ability to disclose and discuss suicidal ideas Ability to demonstrate self-control will improve Ability to identify and develop effective coping behaviors will improve  Medication Management: Evaluate patient's response, side effects, and tolerance of medication regimen.  Therapeutic Interventions: 1 to 1 sessions, Unit Group sessions and Medication administration.  Evaluation of Outcomes: Not Met  Physician Treatment Plan for Secondary Diagnosis: Principal Problem:   Severe major depression without psychotic features (HCC) Active Problems:   MDD (major depressive disorder), recurrent severe, without psychosis (HCC)   Long Term Goal(s): Improvement in symptoms so as ready for discharge  Short Term Goals: Ability to verbalize feelings will improve Ability to disclose and discuss suicidal ideas Ability to demonstrate self-control will improve Ability to identify and develop effective coping behaviors will improve Ability to verbalize feelings will improve Ability to disclose and discuss suicidal ideas Ability to demonstrate self-control  will improve Ability to identify and develop effective coping behaviors will improve  Medication Management: Evaluate patient's response, side effects, and tolerance of medication  regimen.  Therapeutic Interventions: 1 to 1 sessions, Unit Group sessions and Medication administration.  Evaluation of Outcomes: Not Met   RN Treatment Plan for Primary Diagnosis: Severe major depression without psychotic features ([place]) Long Term Goal(s): Knowledge of disease and therapeutic regimen to maintain health will improve  Short Term Goals: Ability to verbalize feelings will improve, Ability to disclose and discuss suicidal ideas and Ability to identify and develop effective coping behaviors will improve  Medication Management: RN will administer medications as ordered by provider, will assess and evaluate patient's response and provide education to patient for prescribed medication. RN will report any adverse and/or side effects to prescribing provider.  Therapeutic Interventions: 1 on 1 counseling sessions, Psychoeducation, Medication administration, Evaluate responses to treatment, Monitor vital signs and CBGs as ordered, Perform/monitor CIWA, COWS, AIMS and Fall Risk screenings as ordered, Perform wound care treatments as ordered.  Evaluation of Outcomes: Not Met   LCSW Treatment Plan for Primary Diagnosis: Severe major depression without psychotic features ([place]) Long Term Goal(s): Safe transition to appropriate next level of care at discharge, Engage patient in therapeutic group addressing interpersonal concerns.  Short Term Goals: Engage patient in aftercare planning with referrals and resources, Identify triggers associated with mental health/substance abuse issues and Increase skills for wellness and recovery  Therapeutic Interventions: Assess for all discharge needs, 1 to 1 time with Social worker, Explore available resources and support systems, Assess for adequacy in community support network, Educate family and significant other(s) on suicide prevention, Complete Psychosocial Assessment, Interpersonal group therapy.  Evaluation of Outcomes: Not Met   Progress in  Treatment: Attending groups: Pt is new to milieu, continuing to assess  Participating in groups: Pt is new to milieu, continuing to assess  Taking medication as prescribed: Yes, MD continues to assess for medication changes as needed Toleration medication: Yes, no side effects reported at this time Family/Significant other contact made: Yes with mother Patient understands diagnosis: Continuing to assess Discussing patient identified problems/goals with staff: Yes Medical problems stabilized or resolved: Yes Denies suicidal/homicidal ideation: Yes Issues/concerns per patient self-inventory: None Other: N/A  New problem(s) identified: None identified at this time.   New Short Term/Long Term Goal(s): None identified at this time.   Discharge Plan or Barriers: Pt will return home and follow-up with outpatient services.   Reason for Continuation of Hospitalization: Anxiety Depression Medication stabilization Suicidal ideation  Estimated Length of Stay: 2-4 days  Attendees: Patient: 09/20/2016  12:48 PM  Physician: Dr. Parke Poisson 09/20/2016  12:48 PM  Nursing: Desma Paganini, RN 09/20/2016  12:48 PM  RN Care Manager: Lars Pinks, RN 09/20/2016  12:48 PM  Social Worker: Adriana Reams, LCSW; Erasmo Downer Drinkard, LCSW 09/20/2016  12:48 PM  Recreational Therapist:  09/20/2016  12:48 PM  Other: Catalina Pizza, NP; Samuel Jester, NP 09/20/2016  12:48 PM  Other:  09/20/2016  12:48 PM  Other: 09/20/2016  12:48 PM    Scribe for Treatment Team: Gladstone Lighter, LCSW 09/20/2016 12:48 PM

## 2016-09-20 NOTE — Progress Notes (Signed)
D: Pt passive SI-contracts for safety denies HI/AVH. Pt is pleasant and cooperative. Pt stated she was sad and anxious this evening.   A: Pt was offered support and encouragement. Pt was given scheduled medications. Pt was encourage to attend groups. Q 15 minute checks were done for safety.   R:Pt attends groups and interacts well with peers and staff. Pt is taking medication. Pt has no complaints.Pt receptive to treatment and safety maintained on unit.

## 2016-09-20 NOTE — [facility] Suicide Risk Assessment (Signed)
[facility]   Nursing information obtained from:   patient and chart  Demographic factors:   20 year old single female, college student Current Mental Status:   as below Loss Factors:   mother [place] to [place], academic pressures, limited support network Historical Factors:   depression, history of self cutting, social anxiety Risk Reduction Factors:   invested in college  Total Time spent with patient: 45 minutes Principal Problem:  MDD  Diagnosis:   Patient Active Problem List   Diagnosis Date Noted  . Generalized anxiety disorder [F41.1] 09/19/2016  . Severe major depression without psychotic features (HCC) [F32.2] 09/19/2016  . MDD (major depressive disorder), recurrent severe, without psychosis (HCC) [F33.2] 09/19/2016    Continued Clinical Symptoms:  Alcohol Use Disorder Identification Test Final Score (AUDIT): 0 The "Alcohol Use Disorders Identification Test", Guidelines for Use in Primary Care, Second Edition.  World Science writerHealth Organization [facility](WHO). Score between 0-7:  no or low risk or alcohol related problems. Score between 8-15:  moderate risk of alcohol related problems. Score between 16-19:  high risk of alcohol related problems. Score 20 or above:  warrants further diagnostic evaluation for alcohol dependence and treatment.   CLINICAL FACTORS:   20 year old single female, college student, history of worsening depression, (+) neuro-vegetative symptoms, , significant anxiety, which she describes as excessive worrying , some panic attacks, and social anxiety. History of self cutting, recent episode of extensive self cutting, both legs and forearms, leading to admission.   Psychiatric Specialty Exam: Physical Exam  ROS  Blood pressure 113/89, pulse 80, temperature 98.4 F (36.9 C), temperature source Oral, resp. rate 18, height 5' 4.75" (1.645 m), weight 192 lb 8 oz (87.3 kg).Body mass index is 32.28 kg/m.   see admit note MSE    COGNITIVE  FEATURES THAT CONTRIBUTE TO RISK:  Closed-mindedness and Loss of executive function    SUICIDE RISK:   Moderate:  Frequent suicidal ideation with limited intensity, and duration, some specificity in terms of plans, no associated intent, good self-control, limited dysphoria/symptomatology, some risk factors present, and identifiable protective factors, including available and accessible social support.   PLAN OF CARE: Patient will be admitted to inpatient psychiatric unit for stabilization and safety. Will provide and encourage milieu participation. Provide medication management and maked adjustments as needed.  Will follow daily.    I certify that inpatient services furnished can reasonably be expected to improve the patient's condition.  Nehemiah MassedOBOS, FERNANDO, MD 09/20/2016, 12:18 PM

## 2016-09-20 NOTE — [facility] Suicide Risk Assessment (Signed)
[facility] INPATIENT:  Family/Significant Other Suicide Prevention Education  Suicide Prevention Education:  Education Completed; Carrie Fischer, Pt's mother [phone],  has been identified by the patient as the family member/significant other with whom the patient will be residing, and identified as the person(s) who will aid the patient in the event of a mental health crisis (suicidal ideations/suicide attempt).  With written consent from the patient, the family member/significant other has been provided the following suicide prevention education, prior to the and/or following the discharge of the patient.  The suicide prevention education provided includes the following:  Suicide risk factors  Suicide prevention and interventions  National Suicide Hotline telephone number  [facility] assessment telephone number  [facility] Emergency Assistance 911  [facility] and/or Residential Mobile Crisis Unit telephone number  Request made of family/significant other to:  Remove weapons (e.g., guns, rifles, knives), all items previously/currently identified as safety concern.    Remove drugs/medications (over-the-counter, prescriptions, illicit drugs), all items previously/currently identified as a safety concern.  The family member/significant other verbalizes understanding of the suicide prevention education information provided.  The family member/significant other agrees to remove the items of safety concern listed above.  Carrie Fischer 09/20/2016, 11:37 AM
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
## 2016-09-20 NOTE — Progress Notes (Signed)
Patient ID: Carrie Fischer, female   DOB: 1996-07-17, 20 y.o.   MRN: [id number]  DAR: Pt. Denies SI/HI and A/V Hallucinations. She reports sleep is fair, energy levels normal, and appetite is poor. She rates depression 6/10, hopelessness 4/10, and anxiety 9/10. PRN Vistaril administered to patient to decrease her anxiety. Her self inflicted cuts to bilateral arms appear to be healing well and are open to air today. Patient does not report any pain or discomfort at this time. Support and encouragement provided to the patient. Scheduled medications administered to patient per physician's orders. Patient is seen in the milieu interacting with peers and attending some groups. Q15 minute checks are maintained for safety.

## 2016-09-20 NOTE — H&P (Signed)
Psychiatric Admission Assessment Adult  Patient Identification: Carrie Fischer MRN:  [id number] Date of Evaluation:  09/20/2016 Chief Complaint:   " I cut myself real bad this time" Principal Diagnosis:MDD, GAD  Diagnosis:   Patient Active Problem List   Diagnosis Date Noted  . Generalized anxiety disorder [F41.1] 09/19/2016  . Severe major depression without psychotic features (HCC) [F32.2] 09/19/2016  . 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 Recent episode of self cutting- cut self on both forearms and on legs- superficial cuts, did not require sutures. States she has been struggling with depression and anxiety for months, but feels that these have worsened recently- and on day of event she was experiencing increased anxiety , related to final exams, grades, and not knowing where she was going to spend thanksgiving break. She has a history of self cutting since [employer], had last cut about three weeks prior. States that the purpose of her cutting was not suicide but to relieve anxiety and tension, but has been experiencing suicidal ideations, with passive thoughts of wanting to die. Associated Signs/Symptoms: Depression Symptoms:  depressed mood, anhedonia, insomnia, recurrent thoughts of death, suicidal thoughts without plan, anxiety, panic attacks, loss of energy/fatigue, decreased appetite, no weight loss  (Hypo) Manic Symptoms: does not endorse  Anxiety Symptoms:  Describes frequent, excessive anxiety, worry, and also frequent panic attacks. She also reports history of social anxiety.  Psychotic Symptoms:  Denies  PTSD Symptoms: Does not endorse current PTSD symptoms  Total Time spent with patient: 45 minutes  Past Psychiatric History: no prior psychiatric admissions, history of self injurious behaviors, hitting head when she  was a child, not since then, history of self cutting, no history of suicide attempts, long history of depression, which she describes as chronic. Also endorses long history of anxiety, including excessive anxiety, panic attacks, some agoraphobia, and social anxiety. Does not endorse history of mania. Denies PTSD   Is the patient at risk to self? Yes.    Has the patient been a risk to self in the past 6 months? Yes.    Has the patient been a risk to self within the distant past? Yes.    Is the patient a risk to others? No.  Has the patient been a risk to others in the past 6 months? No.  Has the patient been a risk to others within the distant past? No.   Prior Inpatient Therapy:  no prior psychiatric admissions  Prior Outpatient Therapy:  has had psychotherapy and counseling in the past, but not recently   Alcohol Screening: 1. How often do you have a drink containing alcohol?: Never 9. Have you or someone else been injured as a result of your drinking?: No 10. Has a relative or friend or a doctor or another health worker been concerned about your drinking or suggested you cut down?: No Alcohol Use Disorder Identification Test Final Score (AUDIT): 0 Brief Intervention: AUDIT score less than 7 or less-screening does not suggest unhealthy drinking-brief intervention not indicated Substance Abuse History in the last 12 months: no alcohol abuse, no drug abuse  Consequences of Substance Abuse: Denies  Previous Psychotropic Medications: states that as a  Child she was briefly on a psychiatric medication but does not remember name.  Psychological Evaluations:  No  Past Medical History: denies medical illnesses, does not smoke, NKDA Past Medical History:  Diagnosis Date  .  Medical history non-contributory    History reviewed. No pertinent surgical history. Family History: Parents alive, separated, father in [place], mother in [place], one full sibling and 11 half siblings Family Psychiatric   History: has an aunt who has depression, anxiety, has attempted suicide, mother has history of depression, father has PTSD , no suicides in family, no substance or alcohol abuse in family  Tobacco Screening: Have you used any form of tobacco in the last 30 days? (Cigarettes, Smokeless Tobacco, Cigars, and/or Pipes): No Social History: single, no children, freshman in college, [place] [address] , studying Biology, not working, denies any legal issues . Mother recently moved to [place], and patient feels she has lost her support system.  No SO at this time. History  Alcohol Use No    Comment: denies     History  Drug Use No    Comment: denies    Additional Social History:      Pain Medications: Pt denies abuse. Prescriptions: Pt denies abuse Over the Counter: Pt denies abuse History of alcohol / drug use?: No history of alcohol / drug abuse Longest period of sobriety (when/how long): n/a  Allergies:  No Known Allergies Lab Results:  Results for orders placed or performed during the hospital encounter of 09/19/16 (from the past 48 hour(s))  Pregnancy, urine     Status: None   Collection Time: 09/19/16 10:06 PM  Result Value Ref Range   Preg Test, Ur NEGATIVE NEGATIVE    Comment:        THE SENSITIVITY OF THIS METHODOLOGY IS >20 mIU/mL. [facility]     Blood Alcohol level:  Lab Results  Component Value Date   [facility] <5 09/18/2016    Metabolic Disorder Labs:  No results found for: HGBA1C, MPG No results found for: PROLACTIN No results found for: CHOL, TRIG, HDL, CHOLHDL, VLDL, LDLCALC  Current Medications: Current Facility-Administered Medications  Medication Dose Route Frequency Provider Last Rate Last Dose  . acetaminophen (TYLENOL) tablet 650 mg  650 mg Oral Q4H PRN Charm RingsJamison Y Lord, NP      . alum & mag hydroxide-simeth (MAALOX/MYLANTA) 200-200-20 MG/5ML suspension 30 mL  30 mL Oral PRN Charm RingsJamison Y Lord, NP      . bacitracin ointment   Topical  PRN Charm RingsJamison Y Lord, NP      . FLUoxetine (PROZAC) capsule 10 mg  10 mg Oral Daily Charm RingsJamison Y Lord, NP      . hydrOXYzine (ATARAX/VISTARIL) tablet 25 mg  25 mg Oral TID PRN Charm RingsJamison Y Lord, NP   25 mg at 09/19/16 1820  . ibuprofen (ADVIL,MOTRIN) tablet 600 mg  600 mg Oral Q8H PRN Charm RingsJamison Y Lord, NP      . Influenza vac split quadrivalent PF (FLUARIX) injection 0.5 mL  0.5 mL Intramuscular Tomorrow-1000 Oluwadamilola Rosamond A Demi Trieu, MD      . magnesium hydroxide (MILK OF MAGNESIA) suspension 30 mL  30 mL Oral Daily PRN Charm RingsJamison Y Lord, NP      . ondansetron [facility](ZOFRAN) tablet 4 mg  4 mg Oral Q8H PRN Charm RingsJamison Y Lord, NP      . traZODone (DESYREL) tablet 50 mg  50 mg Oral QHS,MR X 1 Kerry HoughSpencer E Simon, PA-C   50 mg at 09/19/16 2258   PTA Medications: Prescriptions Prior to Admission  Medication Sig Dispense Refill Last Dose  . Multiple Vitamin (MULTIVITAMIN WITH MINERALS) TABS tablet Take 1 tablet by mouth daily.   09/18/2016  . naproxen sodium (ANAPROX)  220 MG tablet Take 440 mg by mouth every 12 (twelve) hours as needed (for pain).   09/18/2016    Musculoskeletal: Strength & Muscle Tone: within normal limits Gait & Station: normal Patient leans: N/A  Psychiatric Specialty Exam: Physical Exam  Review of Systems  Constitutional: Negative.   HENT: Negative.   Eyes: Negative.   Respiratory: Negative.   Cardiovascular: Negative.   Gastrointestinal: Positive for heartburn. Negative for nausea and vomiting.  Genitourinary: Negative.   Musculoskeletal: Negative.   Skin: Negative.   Neurological: Negative for seizures.  Endo/Heme/Allergies: Negative.   Psychiatric/Behavioral: Positive for depression and suicidal ideas. The patient is nervous/anxious.     Blood pressure 113/89, pulse (!) 102, temperature 98.4 F (36.9 C), temperature source Oral, resp. rate 18, height 5' 4.75" (1.645 m), weight 192 lb 8 oz (87.3 kg).Body mass index is 32.28 kg/m.  General Appearance: Fairly Groomed  Eye Contact:  Good   Speech:  Slow  Volume:  Decreased  Mood:  Anxious and Depressed  Affect:  Constricted and anxious   Thought Process:  Linear  Orientation:  Full (Time, Place, and Person)  Thought Content:  no hallucinations, no delusions, not internally preoccupied   Suicidal Thoughts:  No denies any suicidal or self injurious ideations, contracts for safety on unit   Homicidal Thoughts:  No denies homicidal or violent ideations  Memory:  recent and remote grossly intact   Judgement:  Fair  Insight:  Fair  Psychomotor Activity:  Decreased  Concentration:  Concentration: Good and Attention Span: Good  Recall:  Good  Fund of Knowledge:  Good  Language:  Good  Akathisia:  Negative  Handed:  Right  AIMS (if indicated):     Assets:  Communication Skills Desire for Improvement Resilience  ADL's:  Intact  Cognition:  WNL  Sleep:  Number of Hours: 4    Treatment Plan Summary: Daily contact with patient to assess and evaluate symptoms and progress in treatment, Medication management, Plan inpatient admission  and medications as below  Observation Level/Precautions:  15 minute checks  Laboratory:  as needed   Psychotherapy:  Milieu, group therapy   Medications:  Was started on Prozac 2 days ago- denies side effects, which we have reviewed . Has been taking Vistaril PRNs but states remains anxious, and response to this medication is modest. Will start Ativan PRNs   Consultations: - As needed   Discharge Concerns: -  Estimated LOS: 5-6 days   Other:     Physician Treatment Plan for Primary Diagnosis:  Major Depression, Recurrent, no Psychotic Features, Social Anxiety, consider GAD Long Term Goal(s): Improvement in symptoms so as ready for discharge  Short Term Goals: Ability to verbalize feelings will improve, Ability to disclose and discuss suicidal ideas, Ability to demonstrate self-control will improve and Ability to identify and develop effective coping behaviors will improve  Physician  Treatment Plan for Secondary Diagnosis: Active Problems:   MDD (major depressive disorder), recurrent severe, without psychosis (HCC)  Long Term Goal(s): Improvement in symptoms so as ready for discharge  Short Term Goals: Ability to verbalize feelings will improve, Ability to disclose and discuss suicidal ideas, Ability to demonstrate self-control will improve and Ability to identify and develop effective coping behaviors will improve  I certify that inpatient services furnished can reasonably be expected to improve the patient's condition.    Nehemiah MassedOBOS, Carmelita Amparo, MD 11/16/20178:33 AM

## 2016-09-21 LAB — TSH: TSH: 2.206 u[IU]/mL (ref 0.350–4.500)

## 2016-09-21 MED ORDER — FLUOXETINE HCL 20 MG PO CAPS
20.0000 mg | ORAL_CAPSULE | Freq: Every day | ORAL | Status: DC
  Administered 2016-09-22 – 2016-09-24 (×3): 20 mg via ORAL
  Filled 2016-09-21 (×4): qty 1

## 2016-09-21 NOTE — Progress Notes (Signed)
D: Pt denies SI/HI/AVH. Pt is pleasant and cooperative. Pt seen on the unit in no acute distress, pt laughing and joking with peers in dayroom.   A: Pt was offered support and encouragement. Pt was given scheduled medications. Pt was encourage to attend groups. Q 15 minute checks were done for safety.   R:Pt attends groups and interacts well with peers and staff. Pt is taking medication. Pt has no complaints.Pt receptive to treatment and safety maintained on unit.

## 2016-09-21 NOTE — Progress Notes (Signed)
D Rhiley is seen sitting in the dayroom this morning...she smiles coyly at this nurse and says " hey" when writer introduces herself to pt. A SHe takes her morning meds as planned. She completes her daily assessment and on it she writes she denies SI today and she rates her depression, hopelessness and anxiety" 6/3/6", respectively. She is very focused on " when I can go home" ad staff to cont to work with pt to process need to be here and get full benenfit of hospitalization. R Safety in place,.

## 2016-09-21 NOTE — Progress Notes (Signed)
D: Pt denies SI/HI/AVH. Pt is pleasant and cooperative. Pt stated she was feeling fine, pt sad due to some of her "friends" left today.   A: Pt was offered support and encouragement. Pt was given scheduled medications. Pt was encourage to attend groups. Q 15 minute checks were done for safety.    R:Pt attends groups and interacts well with peers and staff. Pt is taking medication. Pt has no complaints.Pt receptive to treatment and safety maintained on unit.

## 2016-09-21 NOTE — Progress Notes (Signed)
[facility] MD Progress Note  09/21/2016 2:14 PM Carrie Fischer  MRN:  [id number] Subjective: Patient reports she is feeling better today, less severely depressed . Denies any suicidal ideations. States her self inflicted cuts ( on arms , legs ) are healing well , denies pain other than a mild stinging sensation, denies any active bleeding .  At this time does not endorse medication side effects. Today described psychosocial stressors further- explains  she has a more introverted , shy personality style, and that it has been challenging to adapt to family life, because there is a history of her  family dynamics /relationships being  intense and chaotic at times  , causing her significant emotional discomfort , and leading to avoidance as her way to cope .  Objective : I have discussed case with treatment team and have met with patient . Patient remains vaguely depressed, with a constricted, but today more reactive, affect. Denies active SI and contracts for safety on unit. Behavior on unit in good control, calm,  visible on unit, interacting appropriately with some of her peers, some group participation. She reports that group participation can be challenging and anxiety provoking due to social anxiety symptoms. Does not endorse medication side effects at this time, which we have reviewed- she is aware of potential for antidepressants to be associated with increased suicidal ideations early in treatment in adolescents and young adults . Affect responds to support, encouragement, facilitation of discussing her stressors     Principal Problem: Severe major depression without psychotic features ([place]) Diagnosis:   Patient Active Problem List   Diagnosis Date Noted  . Generalized anxiety disorder [F41.1] 09/19/2016  . Severe major depression without psychotic features ([place]) [F32.2] 09/19/2016  . MDD (major depressive disorder), recurrent severe, without psychosis ([place]) [F33.2] 09/19/2016   Total Time  spent with patient: 20 minutes     Past Medical History:  Past Medical History:  Diagnosis Date  . Medical history non-contributory    History reviewed. No pertinent surgical history. Family History: History reviewed. No pertinent family history.  Social History:  History  Alcohol Use No    Comment: denies     History  Drug Use No    Comment: denies    Social History   Social History  . Marital status: Single    Spouse name: N/A  . Number of children: N/A  . Years of education: N/A   Social History Main Topics  . Smoking status: Never Smoker  . Smokeless tobacco: Never Used  . Alcohol use No     Comment: denies  . Drug use: No     Comment: denies  . Sexual activity: No     Comment: denies all   Other Topics Concern  . None   Social History Narrative  . None   Additional Social History:    Pain Medications: Pt denies abuse. Prescriptions: Pt denies abuse Over the Counter: Pt denies abuse History of alcohol / drug use?: No history of alcohol / drug abuse Longest period of sobriety (when/how long): n/a   Sleep: Fair  Appetite:  Fair  Current Medications: Current Facility-Administered Medications  Medication Dose Route Frequency Provider Last Rate Last Dose  . acetaminophen (TYLENOL) tablet 650 mg  650 mg Oral Q4H PRN Patrecia Pour, NP      . alum & mag hydroxide-simeth (MAALOX/MYLANTA) 200-200-20 MG/5ML suspension 30 mL  30 mL Oral PRN Patrecia Pour, NP      . bacitracin ointment  Topical PRN Patrecia Pour, NP      . FLUoxetine (PROZAC) capsule 10 mg  10 mg Oral Daily Patrecia Pour, NP   10 mg at 09/21/16 7591  . ibuprofen (ADVIL,MOTRIN) tablet 600 mg  600 mg Oral Q8H PRN Patrecia Pour, NP      . LORazepam (ATIVAN) tablet 0.5 mg  0.5 mg Oral Q6H PRN Jenne Campus, MD   0.5 mg at 09/21/16 0830  . magnesium hydroxide (MILK OF MAGNESIA) suspension 30 mL  30 mL Oral Daily PRN Patrecia Pour, NP      . ondansetron [facility]) tablet 4 mg  4 mg Oral Q8H  PRN Patrecia Pour, NP      . traZODone (DESYREL) tablet 50 mg  50 mg Oral QHS,MR X 1 Laverle Hobby, PA-C   50 mg at 09/20/16 2311    Lab Results:  Results for orders placed or performed during the hospital encounter of 09/19/16 (from the past 48 hour(s))  Pregnancy, urine     Status: None   Collection Time: 09/19/16 10:06 PM  Result Value Ref Range   Preg Test, Ur NEGATIVE NEGATIVE    Comment:        THE SENSITIVITY OF THIS METHODOLOGY IS >20 mIU/mL. Performed at [facility]   TSH     Status: None   Collection Time: 09/21/16  6:23 AM  Result Value Ref Range   TSH 2.206 0.350 - 4.500 uIU/mL    Comment: Performed by a 3rd Generation assay with a functional sensitivity of <=0.01 uIU/mL. Performed at [facility]     Blood Alcohol level:  Lab Results  Component Value Date   [facility] <5 63/84/6659    Metabolic Disorder Labs: No results found for: HGBA1C, MPG No results found for: PROLACTIN No results found for: CHOL, TRIG, HDL, CHOLHDL, VLDL, LDLCALC  Physical Findings: AIMS: Facial and Oral Movements Muscles of Facial Expression: None, normal Lips and Perioral Area: None, normal Jaw: None, normal Tongue: None, normal,Extremity Movements Upper (arms, wrists, hands, fingers): None, normal Lower (legs, knees, ankles, toes): None, normal, Trunk Movements Neck, shoulders, hips: None, normal, Overall Severity Severity of abnormal movements (highest score from questions above): None, normal Incapacitation due to abnormal movements: None, normal Patient's awareness of abnormal movements (rate only patient's report): No Awareness, Dental Status Current problems with teeth and/or dentures?: No Does patient usually wear dentures?: No  CIWA:    COWS:     Musculoskeletal: Strength & Muscle Tone: within normal limits Gait & Station: normal Patient leans: N/A  Psychiatric Specialty Exam: Physical Exam  ROS no chest pain, no shortness of  breath, no nausea or vomiting   Blood pressure 119/70, pulse 100, temperature 98.1 F (36.7 C), temperature source Oral, resp. rate 16, height 5' 4.75" (1.645 m), weight 192 lb 8 oz (87.3 kg).Body mass index is 32.28 kg/m.  General Appearance: improved grooming   Eye Contact:  Good  Speech:  Normal Rate  Volume:  Decreased  Mood:  remains depressed but states she is feeling better  Affect:  constricted, but smiles at times appropriately  Thought Process:  Linear  Orientation:  Full (Time, Place, and Person)  Thought Content:  denies hallucinations, no delusions, not internally preoccupied   Suicidal Thoughts:  No at this time denies any suicidal or self injurious ideations, denies any homicidal or violent ideations   Homicidal Thoughts:  No  Memory:  recent and remote grossly intact  Judgement:  Other:  improving   Insight:  fair  Psychomotor Activity:  Normal  Concentration:  Concentration: Good and Attention Span: Good  Recall:  Good  Fund of Knowledge:  Good  Language:  Good  Akathisia:  Negative  Handed:  Right  AIMS (if indicated):     Assets:  Desire for Improvement Resilience  ADL's:  Intact  Cognition:  WNL  Sleep:  Number of Hours: 4    Assessment - patient reports partial improvement but some ongoing depression, and continues to present with a constricted but today more  reactive affect. Today denies suicidal ideations, and has been noted to be visible in day room /is interactive with selected peers. Describes long history of mood disorder and of social anxiety, and describes one of her stressors as her family of origin relationships , which are  sometimes chaotic, with other family members  tending to be more extroverted and emotive.   Treatment Plan Summary: Daily contact with patient to assess and evaluate symptoms and progress in treatment, Medication management, Plan inpatient treatment  and medications as below Encourage ongoing group, milieu participation to  work on coping skills and symptom reduction Continue Trazodone 50 mgrs QHS PRN for insomnia Continue Ativan 0.5 mgrs Q 6 hours PRN for anxiety Increase Prozac to 20 mgrs QDAY for depression, anxiety- side effects have been reviewed  Treatment team working on disposition options  Carrie Garnet, MD 09/21/2016, 2:14 PM

## 2016-09-21 NOTE — Progress Notes (Signed)
Recreation Therapy Notes  Date: 09/21/16 Time: 0930 Location: [address] Hall Dayroom  Group Topic: Stress Management  Goal Area(s) Addresses:  Patient will verbalize importance of using healthy stress management.  Patient will identify positive emotions associated with healthy stress management.   Intervention: Stress Management  Activity :  Floating on a Cloud.  LRT introduced the stress management technique of guided imagery.  LRT read a script to allow patients to engage in the technique.  Patients were to follow along as LRT read script.  Education:  Stress Management, Discharge Planning.   Education Outcome: Acknowledges edcuation/In group clarification offered/Needs additional education  Clinical Observations/Feedback: Pt did not attend group.   Caroll RancherMarjette Gracemarie Skeet, LRT/CTRS         Caroll RancherLindsay, Mahlani Berninger A 09/21/2016 12:32 PM

## 2016-09-21 NOTE — [facility] Group Notes (Signed)
[facility] LCSW Group Therapy 09/21/2016 1:15pm  Type of Therapy: Group Therapy- Feelings Around Relapse and Recovery  Pt did not attend, declined invitation.    Vernie ShanksLauren Beryl Balz, LCSW [phone] 09/21/2016 1:56 PM

## 2016-09-22 NOTE — [facility] Group Notes (Signed)
[facility] Group Notes: (Clinical Social Work)   09/22/2016      Type of Therapy:  Group Therapy   Participation Level:  Did Not Attend despite [facility] prompting   Carrie MantleMareida Grossman-Orr, LCSW 09/22/2016, 12:12 PM

## 2016-09-22 NOTE — Plan of Care (Signed)
Problem: Safety: Goal: Periods of time without injury will increase Outcome: Progressing Pt. remains a low fall risk, denies SI/HI at this time, Q 15 checks in place.    

## 2016-09-22 NOTE — Progress Notes (Signed)
D: Pt. is up and visible in the milieu, sitting quietly in the dayroom. Denies having any SI/HI/AVH/Pain at this time. Pt. presents with a depressed & anxious affect and mood. Pt. states " I just feel very anxious, I am worried about going back to school". Pt. was minimal and forwards little with conversation, was cooperative and receptive to advice.   A: Encouragement and support given. Meds. ordered and given.   R: 1:1 interaction in private. Safety maintained with Q 15 checks. Continues to follow treatment plan and will monitor closely. No additonal questions/concerns at this time.

## 2016-09-22 NOTE — Progress Notes (Signed)
D: Patient with animated affect, stated that she spoke with her brother on the phone and he is disappointed that she is in the hospital and she feels depressed and anxious that she is letting him down. Reported good sleep and appetite. Not interacting much on the unit, patient said that she feels so much younger than everyone else.  A: Encouraged to focus on her own needs and reassured her that her being in the hospital does not hurt her brother. Encouraged patient to participate in unit activities. R: Patient safety maintained, Denies SI/HI/AVH.

## 2016-09-22 NOTE — [facility] Group Notes (Signed)
Patient attend group. Her day was a 5. Her goal was to be talkative to people and be more social than she was when she first came. She accomplish her goal she has been out of her room talking more to the other patients on the unit.

## 2016-09-22 NOTE — [facility] Group Notes (Signed)
Adult Psychoeducational Group Note  Date:  09/22/2016 Time:  9:36 PM  Group Topic/Focus:  Wrap-Up Group:   The focus of this group is to help patients review their daily goal of treatment and discuss progress on daily workbooks.   Participation Level:  Active  Participation Quality:  Appropriate  Affect:  Appropriate  Cognitive:  Alert  Insight: Good  Engagement in Group:  Engaged  Modes of Intervention:  Discussion and Education  Additional Comments:  Patient reported working on leaving her room and interacting with others during the day.  Patient confirmed wanting to working on her discharge and coping with transition back into her school/university.  Patient reported feeling that others were spreading [facility]  Patient agreed to work on identifying positives of self to assist in her transition.   Elmore GuiseSLOAN, Carrie Fischer 09/22/2016, 9:36 PM

## 2016-09-22 NOTE — Progress Notes (Signed)
[facility] MD Progress Note  09/22/2016 3:53 PM Carissa Musick  MRN:  [id number] Subjective: Patient continues to report some improvement compared to admission , states her mood is " getting better". States she felt better yesterday evening because two good friends from college came to visit her and she is expecting them again this evening . She denies medication side effects. She states her mood is better, but her anxiety is more persistent and ongoing, reports increased anxiety in groups and " when I am around people", and reports symptoms of social anxiety, such as fear of speaking in front of others due to being judged or " of saying something people will find offensive or dumb"  Objective : I have discussed case with treatment team and have met with patient . Mood continues to improve, and at this time she smiles often and generally presents with a more animated and less constricted affect. She continues to report significant anxiety, and as above, emphasizes social anxiety type symptoms. Denies side effects, currently on Prozac. States " I don't think it is helping much yet ", but expresses optimism it will work for her. No disruptive or agitated behaviors on unit, going to some groups. Denies suicidal ideations.    Principal Problem: Severe major depression without psychotic features ([place]) Diagnosis:   Patient Active Problem List   Diagnosis Date Noted  . Generalized anxiety disorder [F41.1] 09/19/2016  . Severe major depression without psychotic features ([place]) [F32.2] 09/19/2016  . MDD (major depressive disorder), recurrent severe, without psychosis ([place]) [F33.2] 09/19/2016   Total Time spent with patient: 20 minutes     Past Medical History:  Past Medical History:  Diagnosis Date  . Medical history non-contributory    History reviewed. No pertinent surgical history. Family History: History reviewed. No pertinent family history.  Social History:  History  Alcohol Use No   Comment: denies     History  Drug Use No    Comment: denies    Social History   Social History  . Marital status: Single    Spouse name: N/A  . Number of children: N/A  . Years of education: N/A   Social History Main Topics  . Smoking status: Never Smoker  . Smokeless tobacco: Never Used  . Alcohol use No     Comment: denies  . Drug use: No     Comment: denies  . Sexual activity: No     Comment: denies all   Other Topics Concern  . None   Social History Narrative  . None   Additional Social History:    Pain Medications: Pt denies abuse. Prescriptions: Pt denies abuse Over the Counter: Pt denies abuse History of alcohol / drug use?: No history of alcohol / drug abuse Longest period of sobriety (when/how long): n/a   Sleep: Fair  Appetite:   good  Current Medications: Current Facility-Administered Medications  Medication Dose Route Frequency Provider Last Rate Last Dose  . acetaminophen (TYLENOL) tablet 650 mg  650 mg Oral Q4H PRN Patrecia Pour, NP      . alum & mag hydroxide-simeth (MAALOX/MYLANTA) 200-200-20 MG/5ML suspension 30 mL  30 mL Oral PRN Patrecia Pour, NP      . bacitracin ointment   Topical PRN Patrecia Pour, NP      . FLUoxetine (PROZAC) capsule 20 mg  20 mg Oral Daily Jenne Campus, MD   20 mg at 09/22/16 0843  . ibuprofen (ADVIL,MOTRIN) tablet 600 mg  600 mg Oral  Q8H PRN Patrecia Pour, NP      . LORazepam (ATIVAN) tablet 0.5 mg  0.5 mg Oral Q6H PRN Jenne Campus, MD   0.5 mg at 09/22/16 0845  . magnesium hydroxide (MILK OF MAGNESIA) suspension 30 mL  30 mL Oral Daily PRN Patrecia Pour, NP      . ondansetron [facility]) tablet 4 mg  4 mg Oral Q8H PRN Patrecia Pour, NP      . traZODone (DESYREL) tablet 50 mg  50 mg Oral QHS,MR X 1 Laverle Hobby, PA-C   50 mg at 09/21/16 2208    Lab Results:  Results for orders placed or performed during the hospital encounter of 09/19/16 (from the past 48 hour(s))  TSH     Status: None   Collection  Time: 09/21/16  6:23 AM  Result Value Ref Range   TSH 2.206 0.350 - 4.500 uIU/mL    Comment: Performed by a 3rd Generation assay with a functional sensitivity of <=0.01 uIU/mL. Performed at [facility]     Blood Alcohol level:  Lab Results  Component Value Date   [facility] <5 29/93/7169    Metabolic Disorder Labs: No results found for: HGBA1C, MPG No results found for: PROLACTIN No results found for: CHOL, TRIG, HDL, CHOLHDL, VLDL, LDLCALC  Physical Findings: AIMS: Facial and Oral Movements Muscles of Facial Expression: None, normal Lips and Perioral Area: None, normal Jaw: None, normal Tongue: None, normal,Extremity Movements Upper (arms, wrists, hands, fingers): None, normal Lower (legs, knees, ankles, toes): None, normal, Trunk Movements Neck, shoulders, hips: None, normal, Overall Severity Severity of abnormal movements (highest score from questions above): None, normal Incapacitation due to abnormal movements: None, normal Patient's awareness of abnormal movements (rate only patient's report): No Awareness, Dental Status Current problems with teeth and/or dentures?: No Does patient usually wear dentures?: No  CIWA:    COWS:     Musculoskeletal: Strength & Muscle Tone: within normal limits Gait & Station: normal Patient leans: N/A  Psychiatric Specialty Exam: Physical Exam  ROS no chest pain, no shortness of breath, no nausea or vomiting   Blood pressure (!) 105/57, pulse (!) 117, temperature 98.1 F (36.7 C), temperature source Oral, resp. rate 18, height 5' 4.75" (1.645 m), weight 192 lb 8 oz (87.3 kg).Body mass index is 32.28 kg/m.  General Appearance: improved grooming   Eye Contact:  Good  Speech:  Normal Rate  Volume:  Normal  Mood:  Less depressed, more reactive affect   Affect:  Improving affect , remains anxious   Thought Process:  Linear  Orientation:  Full (Time, Place, and Person)  Thought Content:  denies hallucinations, no  delusions, not internally preoccupied   Suicidal Thoughts:  No at this time denies any suicidal or self injurious ideations, denies any homicidal or violent ideations   Homicidal Thoughts:  No  Memory:  recent and remote grossly intact   Judgement:  Other:  improving   Insight:  Improving   Psychomotor Activity:  Normal  Concentration:  Concentration: Good and Attention Span: Good  Recall:  Good  Fund of Knowledge:  Good  Language:  Good  Akathisia:  Negative  Handed:  Right  AIMS (if indicated):     Assets:  Desire for Improvement Resilience  ADL's:  Intact  Cognition:  WNL  Sleep:  Number of Hours: 6.5    Assessment - patient presents with improving mood and range of affect, less depressed, at this time denies suicidal ideations.  She reports persistent significant anxiety, and describes symptoms of social anxiety, but has been able to participate in some groups and is visible in milieu. Tolerating Prozac trial well thus far.    Treatment Plan Summary: Daily contact with patient to assess and evaluate symptoms and progress in treatment, Medication management, Plan inpatient treatment  and medications as below Encourage ongoing group, milieu participation to work on coping skills and symptom reduction Continue Trazodone 50 mgrs QHS PRN for insomnia Continue Ativan 0.5 mgrs Q 6 hours PRN for anxiety Continue Prozac 20 mgrs QDAY for depression, anxiety  Treatment team working on disposition options  Neita Garnet, MD 09/22/2016, 3:53 PM   Patient ID: Jodelle Red, female   DOB: 04/27/1996, 20 y.o.   MRN: [id number]

## 2016-09-23 NOTE — Progress Notes (Addendum)
[facility] MD Progress Note  09/23/2016 10:14 AM Carrie Fischer  MRN:  [id number] Subjective: Patient states she is feeling partially better , but today feeling more depressed, after a conversation with her brother. States that brother meant well, but was essentially telling her that depression is a choice and that she could " snap out of it" if she makes more of an effort. States " that's the problem with my family, they do not understand depression, and by trying to help they make me feel worse about myself". Denies suicidal ideations at this time. Denies medication side effects.   Objective : I have discussed case with treatment team and have met with patient . Patient presents vaguely depressed, sad, but denies any suicidal ideations. She denies medication side effects. As above, ruminative about family issues as above . Responds well to support, encouragement ,empathy- affect improved partially during session. Visible on unit, going to some groups, no disruptive, agitated , or self injurious behaviors on unit     Principal Problem: Severe major depression without psychotic features [facility]) Diagnosis:   Patient Active Problem List   Diagnosis Date Noted  . Generalized anxiety disorder [F41.1] 09/19/2016  . Severe major depression without psychotic features ([place]) [F32.2] 09/19/2016  . MDD (major depressive disorder), recurrent severe, without psychosis ([place]) [F33.2] 09/19/2016   Total Time spent with patient: 20 minutes     Past Medical History:  Past Medical History:  Diagnosis Date  . Medical history non-contributory    History reviewed. No pertinent surgical history. Family History: History reviewed. No pertinent family history.  Social History:  History  Alcohol Use No    Comment: denies     History  Drug Use No    Comment: denies    Social History   Social History  . Marital status: Single    Spouse name: N/A  . Number of children: N/A  . Years of education: N/A    Social History Main Topics  . Smoking status: Never Smoker  . Smokeless tobacco: Never Used  . Alcohol use No     Comment: denies  . Drug use: No     Comment: denies  . Sexual activity: No     Comment: denies all   Other Topics Concern  . None   Social History Narrative  . None   Additional Social History:    Pain Medications: Pt denies abuse. Prescriptions: Pt denies abuse Over the Counter: Pt denies abuse History of alcohol / drug use?: No history of alcohol / drug abuse Longest period of sobriety (when/how long): n/a   Sleep: improved  Appetite:   good  Current Medications: Current Facility-Administered Medications  Medication Dose Route Frequency Provider Last Rate Last Dose  . acetaminophen (TYLENOL) tablet 650 mg  650 mg Oral Q4H PRN Patrecia Pour, NP      . alum & mag hydroxide-simeth (MAALOX/MYLANTA) 200-200-20 MG/5ML suspension 30 mL  30 mL Oral PRN Patrecia Pour, NP      . bacitracin ointment   Topical PRN Patrecia Pour, NP      . FLUoxetine (PROZAC) capsule 20 mg  20 mg Oral Daily Jenne Campus, MD   20 mg at 09/23/16 0757  . ibuprofen (ADVIL,MOTRIN) tablet 600 mg  600 mg Oral Q8H PRN Patrecia Pour, NP      . LORazepam (ATIVAN) tablet 0.5 mg  0.5 mg Oral Q6H PRN Jenne Campus, MD   0.5 mg at 09/23/16 0758  . magnesium  hydroxide (MILK OF MAGNESIA) suspension 30 mL  30 mL Oral Daily PRN Patrecia Pour, NP      . ondansetron [facility]) tablet 4 mg  4 mg Oral Q8H PRN Patrecia Pour, NP      . traZODone (DESYREL) tablet 50 mg  50 mg Oral QHS,MR X 1 Laverle Hobby, PA-C   50 mg at 09/22/16 2213    Lab Results:  No results found for this or any previous visit (from the past 50 hour(s)).  Blood Alcohol level:  Lab Results  Component Value Date   ETH <5 00/17/4944    Metabolic Disorder Labs: No results found for: HGBA1C, MPG No results found for: PROLACTIN No results found for: CHOL, TRIG, HDL, CHOLHDL, VLDL, LDLCALC  Physical Findings: AIMS:  Facial and Oral Movements Muscles of Facial Expression: None, normal Lips and Perioral Area: None, normal Jaw: None, normal Tongue: None, normal,Extremity Movements Upper (arms, wrists, hands, fingers): None, normal Lower (legs, knees, ankles, toes): None, normal, Trunk Movements Neck, shoulders, hips: None, normal, Overall Severity Severity of abnormal movements (highest score from questions above): None, normal Incapacitation due to abnormal movements: None, normal Patient's awareness of abnormal movements (rate only patient's report): No Awareness, Dental Status Current problems with teeth and/or dentures?: No Does patient usually wear dentures?: No  CIWA:    COWS:     Musculoskeletal: Strength & Muscle Tone: within normal limits Gait & Station: normal Patient leans: N/A  Psychiatric Specialty Exam: Physical Exam  ROS no chest pain, no shortness of breath, no nausea or vomiting   Blood pressure 110/85, pulse (!) 130, temperature 98.5 F (36.9 C), temperature source Oral, resp. rate 19, height 5' 4.75" (1.645 m), weight 192 lb 8 oz (87.3 kg).Body mass index is 32.28 kg/m.  General Appearance: improved grooming   Eye Contact:  Good  Speech:  Normal Rate  Volume:  Normal  Mood: partially improved but still depressed   Affect: constricted, does smile briefly at times, less anxious    Thought Process:  Linear  Orientation:  Full (Time, Place, and Person)  Thought Content:  denies hallucinations, no delusions, not internally preoccupied   Suicidal Thoughts:  No at this time denies any suicidal or self injurious ideations, denies any homicidal or violent ideations   Homicidal Thoughts:  No  Memory:  recent and remote grossly intact   Judgement:  Other:  improving   Insight:  Improving   Psychomotor Activity:  Normal  Concentration:  Concentration: Good and Attention Span: Good  Recall:  Good  Fund of Knowledge:  Good  Language:  Good  Akathisia:  Negative  Handed:  Right   AIMS (if indicated):     Assets:  Desire for Improvement Resilience  ADL's:  Intact  Cognition:  WNL  Sleep:  Number of Hours: 6.75    Assessment - patient reports partial improvement compared to admission, but states she is feeling more depressed today following phone conversation with brother. States she feels family does not understand her mental illness and try but are not supportive . Denies active SI, contracts for safety. Thus far tolerating medications well .    Treatment Plan Summary: Daily contact with patient to assess and evaluate symptoms and progress in treatment, Medication management, Plan inpatient treatment  and medications as below Encourage ongoing group, milieu participation to work on coping skills and symptom reduction Continue Trazodone 50 mgrs QHS PRN for insomnia Continue Ativan 0.5 mgrs Q 6 hours PRN for anxiety Continue Prozac  20 mgrs QDAY for depression, anxiety  Treatment team working on disposition options - patient expressing  Interest in [place] as possible disposition plan. Consider family meeting prior to discharge , if possible . Carrie Garnet, MD 09/23/2016, 10:14 AM   Patient ID: Carrie Fischer, female   DOB: Aug 19, 1996, 20 y.o.   MRN: [id number]

## 2016-09-23 NOTE — [facility] Group Notes (Signed)
Adult Therapy Group Note  Date: 09/23/2016  Time:  9:00-10:00AM  Group Topic/Focus: Healthy Press photographerupport Systems   Building Self Esteem:   The focus of this group was to assist patients in identifying their current healthy supports and unhealthy supports, then discussing how to add additional healthy supports and reduce existing unhealthy ones.  The healthy additions included supports such as 12-step groups, individual therapy, psychiatrists, faith activities,  sponsors, group therapy, support groups, classes on mental health, and more.    Participation Level:  Active  Participation Quality:  Attentive  Affect:  Blunted  Cognitive:  Appropriate  Insight: Good  Engagement in Group:  Engaged  Modes of Intervention:  Discussion and Support  Additional Comments:  The patient expressed that a current health support is her best friend, her mother, and her brother, while her unhealthy supports include her father, aunts, cousins and ex-boyfriend. She did not talk a great deal in group, but when she did, spoke up for herself very adamantly saying she had something to share.  Lynnell ChadMareida J Grossman-Orr, LCSW 09/23/2016  1:11 PM

## 2016-09-23 NOTE — Progress Notes (Signed)
Adult Psychoeducational Group Note  Date:  09/23/2016 Time:  9:01 PM  Group Topic/Focus:  Wrap-Up Group:   The focus of this group is to help patients review their daily goal of treatment and discuss progress on daily workbooks.   Participation Level:  Active  Participation Quality:  Appropriate  Affect:  Appropriate  Cognitive:  Appropriate  Insight: Appropriate  Engagement in Group:  Engaged and Improving  Modes of Intervention:  Discussion  Additional Comments:  Pt stated she had a good day, she did not meet her goal of leaving today, but is hopeful to leave soon.  Jacques Navyhillips, Thuy Atilano A 09/23/2016, 9:01 PM

## 2016-09-23 NOTE — Progress Notes (Addendum)
Carrie Fischer is observed in the dayroom. She is watching TV, talking and laughing with her peers, coloring in the coloring books. She can demonstrate child-like voices and behaviors at times.  A She completes her daily assessment and on it she wrote  She denied SI and she rated her depression, hopelessness and axneity " 5/5/7", respectively. She minimizes  Her problems and denies her feelings, often overheard saying " its really not that big of a deal".  R Safety in place and poc cotn.

## 2016-09-24 DIAGNOSIS — F322 Major depressive disorder, single episode, severe without psychotic features: Secondary | ICD-10-CM

## 2016-09-24 MED ORDER — FLUOXETINE HCL 20 MG PO CAPS: 20.0000 mg | ORAL_CAPSULE | Freq: Every day | ORAL | 0 refills | Status: AC

## 2016-09-24 MED ORDER — HYDROXYZINE HCL 50 MG PO TABS: 50.0000 mg | ORAL_TABLET | Freq: Two times a day (BID) | ORAL | 0 refills | Status: AC | PRN

## 2016-09-24 MED ORDER — BACITRACIN ZINC 500 UNIT/GM EX OINT: TOPICAL_OINTMENT | CUTANEOUS | 0 refills | Status: AC | PRN

## 2016-09-24 MED ORDER — HYDROXYZINE HCL 50 MG PO TABS: 50.0000 mg | ORAL_TABLET | Freq: Two times a day (BID) | ORAL | Status: DC | PRN

## 2016-09-24 MED ORDER — TRAZODONE HCL 50 MG PO TABS: 50.0000 mg | ORAL_TABLET | Freq: Every evening | ORAL | 0 refills | Status: AC | PRN

## 2016-09-24 NOTE — Discharge Summary (Signed)
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 Recent episode of self cutting- cut self on both forearms and on legs- superficial cuts, did not require sutures. States she has been struggling with depression and anxiety for months, but feels that these have worsened recently- and on day of event she was experiencing increased anxiety , related to final exams, grades, and not knowing where she was going to spend thanksgiving break. She has a history of self cutting since [employer], had last cut about three weeks prior. States that the purpose of her cutting was not suicide but to relieve anxiety and tension, but has been experiencing suicidal ideations, with passive thoughts of wanting to die.  Principal Problem: Severe major depression without psychotic features [facility]) Discharge Diagnoses: Patient Active Problem List   Diagnosis Date Noted  . Generalized anxiety disorder [F41.1] 09/19/2016  . Severe major depression without psychotic features ([facility]) [F32.2] 09/19/2016  . MDD (major depressive disorder), recurrent severe, without psychosis ([facility]) [F33.2] 09/19/2016    Past Psychiatric History:   Past Medical History:  Past Medical History:  Diagnosis Date  . Medical history non-contributory    History reviewed. No pertinent surgical history. Family History: History reviewed. No pertinent family history. Family Psychiatric  History:  Social History:  History  Alcohol Use No    Comment: denies     History  Drug Use No    Comment: denies    Social History   Social History  . Marital status: Single    Spouse name: N/A  .  Number of children: N/A  . Years of education: N/A   Social History Main Topics  . Smoking status: Never Smoker  . Smokeless tobacco: Never Used  . Alcohol use No     Comment: denies  . Drug use: No     Comment: denies  . Sexual activity: No     Comment: denies all   Other Topics Concern  . None   Social History Narrative  . None    Hospital Course: Carrie Fischer was admitted for Severe major depression without psychotic features (HCC)  and crisis management.  Pt was treated discharged with the medications listed below under Medication List.  Medical problems were identified and treated as needed.  Home medications were restarted as appropriate.  Improvement was monitored by observation and Carrie Fischer 's daily report of symptom reduction.  Emotional and mental status was monitored by daily self-inventory reports completed by Carrie Fischer and clinical staff.         Carrie Fischer was evaluated by the treatment team for stability and plans for continued recovery upon discharge. Carrie Fischer 's motivation was an integral factor for scheduling further treatment. Employment, transportation, bed availability, health status, family support, and any pending legal issues were also considered during hospital stay. Pt was offered further treatment options upon discharge including but not limited to Residential, Intensive Outpatient, and Outpatient treatment.  Carrie Fischer will follow up with the services as listed below under Follow Up Information.     Upon completion of this admission the patient was both mentally and medically stable for discharge denying suicidal/homicidal ideation,  auditory/visual/tactile hallucinations, delusional thoughts and paranoia.    Carrie Fischer responded well to treatment with Prozac 20 mg and trazodone 50 mg without adverse effects. Pt demonstrated improvement without reported or observed adverse effects to the point of stability appropriate for  outpatient management. Pertinent labs include:CBC for which outpatient follow-up is necessary for lab recheck as mentioned below. Reviewed CBC, CMP, BAL, and UDS; all unremarkable aside from noted exceptions.   Physical Findings: AIMS: Facial and Oral Movements Muscles of Facial Expression: None, normal Lips and Perioral Area: None, normal Jaw: None, normal Tongue: None, normal,Extremity Movements Upper (arms, wrists, hands, fingers): None, normal Lower (legs, knees, ankles, toes): None, normal, Trunk Movements Neck, shoulders, hips: None, normal, Overall Severity Severity of abnormal movements (highest score from questions above): None, normal Incapacitation due to abnormal movements: None, normal Patient's awareness of abnormal movements (rate only patient's report): No Awareness, Dental Status Current problems with teeth and/or dentures?: No Does patient usually wear dentures?: No  CIWA:    COWS:     Musculoskeletal: Strength & Muscle Tone: within normal limits Gait & Station: normal Patient leans: N/A  Psychiatric Specialty Exam: SEE SRA by MD Physical Exam  Nursing note and vitals reviewed. Constitutional: She is oriented to person, place, and time.  Neurological: She is alert and oriented to person, place, and time.  Psychiatric: She has a normal mood and affect. Her behavior is normal.    ROS  Blood pressure 114/81, pulse (!) 110, temperature 98.3 F (36.8 C), temperature source Oral, resp. rate 16, height 5' 4.75" (1.645 m), weight 87.3 kg (192 lb 8 oz).Body mass index is 32.28 kg/m.    Have you used any form of tobacco in the last 30 days? (Cigarettes, Smokeless Tobacco, Cigars, and/or Pipes): No  Has this patient used any form of tobacco in the last 30 days? (Cigarettes, Smokeless Tobacco, Cigars, and/or Pipes)  No  Blood Alcohol level:  Lab Results  Component Value Date   ETH <5 09/18/2016    Metabolic Disorder Labs:  No results found for: HGBA1C, MPG No  results found for: PROLACTIN No results found for: CHOL, TRIG, HDL, CHOLHDL, VLDL, LDLCALC  See Psychiatric Specialty Exam and Suicide Risk Assessment completed by Attending Physician prior to discharge.  Discharge destination:  Home  Is patient on multiple antipsychotic therapies at discharge:  No   Has Patient had three or more failed trials of antipsychotic monotherapy by history:  No  Recommended Plan for Multiple Antipsychotic Therapies: NA  Discharge Instructions    Diet - low sodium heart healthy    Complete by:  As directed    Discharge instructions    Complete by:  As directed    Take all medications as prescribed. Keep all follow-up appointments as scheduled.  Do not consume alcohol or use illegal drugs while on prescription medications. Report any adverse effects from your medications to your primary care provider promptly.  In the event of recurrent symptoms or worsening symptoms, call 911, a crisis hotline, or go to the nearest emergency department for evaluation.   Increase activity slowly    Complete by:  As directed        Medication List    STOP taking these medications   multivitamin with minerals Tabs tablet   naproxen sodium 220 MG tablet Commonly known as:  ANAPROX     TAKE these medications     Indication  bacitracin ointment Apply topically as needed for wound care.  Indication:  abrasion to arm/legs  FLUoxetine 20 MG capsule Commonly known as:  PROZAC Take 1 capsule (20 mg total) by mouth daily. Start taking on:  09/25/2016  Indication:  Depression, Major Depressive Disorder   traZODone 50 MG tablet Commonly known as:  DESYREL Take 1 tablet (50 mg total) by mouth at bedtime and may repeat dose one time if needed.  Indication:  Trouble Sleeping      Follow-up Information    [facility].   Why:  12/5 at 4:00pm [facility]. 12/6 at 11:30am for your follow-up with  the counseling center. 12/15 at 1:00pm for medication management with Bertis RuddyMegan Blankman, NP Contact information: [facility] [address] [place] [address]       [facility] on 10/01/2016.   Why:  at 4:40pm with Freddrick MarchLeeland [facility]field for therapy. Contact information: [address] [place], [place] [address] Tel: [phone] Fax: [phone]          Follow-up recommendations:  Activity:  as tolerated Diet:  heart healthy  Comments:  Take all medications as prescribed. Keep all follow-up appointments as scheduled.  Do not consume alcohol or use illegal drugs while on prescription medications. Report any adverse effects from your medications to your primary care provider promptly.  In the event of recurrent symptoms or worsening symptoms, call 911, a crisis hotline, or go to the nearest emergency department for evaluation.   Signed: Oneta Rackanika N Makella Buckingham, NP 09/24/2016, 9:20 AM

## 2016-09-24 NOTE — Progress Notes (Signed)
D:  Patient's self inventory sheet, patient sleeps good, sleep medication is helpful.  Fair appetite, normal energy level, good concentration.  Rated depression and hopeless 2, anxiety 6.  Denied withdrawals.  Denied SI.  Denied physical problems.  Denied pain.  Goal is to discharge.  Plans to be happy. Does have discharge plans. A:  Medications administered per MD orders.  Emotional support and encouragement given patient. R:  Patient denied SI and HI, contracts for safety.  Denied A/V hallucinations.  Safety maintained with 15 minute checks.

## 2016-09-24 NOTE — Progress Notes (Signed)
Discharge Note:  Patient discharged with friend.  Patient denied SI and HI.  Denied A/V hallucinations.  Suicide prevention information given and discussed with patient who stated she understood and had no questions.  Patient stated she received all her belongings, clothing, toiletries, misc items, prescriptions.  Patient stated [facility].  All required discharge information given to patient at discharge.

## 2016-09-24 NOTE — Plan of Care (Signed)
Problem: Coping: Goal: Ability to verbalize feelings will improve Outcome: Not Progressing Patient minimal during interaction with staff. Patient superficially socializing with peers.   Problem: Activity: Goal: Interest or engagement in activities will improve Outcome: Progressing Patient attending groups and interacting with peers on the unit.

## 2016-09-24 NOTE — Tx Team (Signed)
Interdisciplinary Treatment and Diagnostic Plan Update  09/24/2016 Time of Session: 8:41 AM  Carrie Fischer MRN: [id number]  Principal Diagnosis: Severe major depression without psychotic features (HCC)  Secondary Diagnoses: Principal Problem:   Severe major depression without psychotic features (HCC) Active Problems:   MDD (major depressive disorder), recurrent severe, without psychosis (HCC)   Current Medications:  Current Facility-Administered Medications  Medication Dose Route Frequency Provider Last Rate Last Dose  . acetaminophen (TYLENOL) tablet 650 mg  650 mg Oral Q4H PRN Charm RingsJamison Y Lord, NP      . alum & mag hydroxide-simeth (MAALOX/MYLANTA) 200-200-20 MG/5ML suspension 30 mL  30 mL Oral PRN Charm RingsJamison Y Lord, NP      . bacitracin ointment   Topical PRN Charm RingsJamison Y Lord, NP      . FLUoxetine (PROZAC) capsule 20 mg  20 mg Oral Daily Craige CottaFernando A Cobos, MD   20 mg at 09/24/16 0837  . ibuprofen (ADVIL,MOTRIN) tablet 600 mg  600 mg Oral Q8H PRN Charm RingsJamison Y Lord, NP      . LORazepam (ATIVAN) tablet 0.5 mg  0.5 mg Oral Q6H PRN Craige CottaFernando A Cobos, MD   0.5 mg at 09/24/16 0839  . magnesium hydroxide (MILK OF MAGNESIA) suspension 30 mL  30 mL Oral Daily PRN Charm RingsJamison Y Lord, NP      . ondansetron [facility](ZOFRAN) tablet 4 mg  4 mg Oral Q8H PRN Charm RingsJamison Y Lord, NP      . traZODone (DESYREL) tablet 50 mg  50 mg Oral QHS,MR X 1 Kerry HoughSpencer E Simon, PA-C   50 mg at 09/23/16 2148    PTA Medications: Prescriptions Prior to Admission  Medication Sig Dispense Refill Last Dose  . Multiple Vitamin (MULTIVITAMIN WITH MINERALS) TABS tablet Take 1 tablet by mouth daily.   09/18/2016  . naproxen sodium (ANAPROX) 220 MG tablet Take 440 mg by mouth every 12 (twelve) hours as needed (for pain).   09/18/2016    Treatment Modalities: Medication Management, Group therapy, Case management,  1 to 1 session with clinician, Psychoeducation, Recreational therapy.  Patient Stressors: Financial difficulties Loss of family  member Marital or family conflict  Patient Strengths: Ability for insight Average or above average intelligence Capable of independent living Barrister's clerkCommunication skills Motivation for treatment/growth Special hobby/interest Supportive family/friends  Physician Treatment Plan for Primary Diagnosis: Severe major depression without psychotic features (HCC) Long Term Goal(s): Improvement in symptoms so as ready for discharge  Short Term Goals: Ability to verbalize feelings will improve Ability to disclose and discuss suicidal ideas Ability to demonstrate self-control will improve Ability to identify and develop effective coping behaviors will improve Ability to verbalize feelings will improve Ability to disclose and discuss suicidal ideas Ability to demonstrate self-control will improve Ability to identify and develop effective coping behaviors will improve  Medication Management: Evaluate patient's response, side effects, and tolerance of medication regimen.  Therapeutic Interventions: 1 to 1 sessions, Unit Group sessions and Medication administration.  Evaluation of Outcomes: Adequate for Discharge  Physician Treatment Plan for Secondary Diagnosis: Principal Problem:   Severe major depression without psychotic features (HCC) Active Problems:   MDD (major depressive disorder), recurrent severe, without psychosis (HCC)   Long Term Goal(s): Improvement in symptoms so as ready for discharge  Short Term Goals: Ability to verbalize feelings will improve Ability to disclose and discuss suicidal ideas Ability to demonstrate self-control will improve Ability to identify and develop effective coping behaviors will improve Ability to verbalize feelings will improve Ability to disclose and discuss suicidal ideas  Ability to demonstrate self-control will improve Ability to identify and develop effective coping behaviors will improve  Medication Management: Evaluate patient's response, side  effects, and tolerance of medication regimen.  Therapeutic Interventions: 1 to 1 sessions, Unit Group sessions and Medication administration.  Evaluation of Outcomes: Adequate for Discharge   RN Treatment Plan for Primary Diagnosis: Severe major depression without psychotic features (HCC) Long Term Goal(s): Knowledge of disease and therapeutic regimen to maintain health will improve  Short Term Goals: Ability to verbalize feelings will improve, Ability to disclose and discuss suicidal ideas and Ability to identify and develop effective coping behaviors will improve  Medication Management: RN will administer medications as ordered by provider, will assess and evaluate patient's response and provide education to patient for prescribed medication. RN will report any adverse and/or side effects to prescribing provider.  Therapeutic Interventions: 1 on 1 counseling sessions, Psychoeducation, Medication administration, Evaluate responses to treatment, Monitor vital signs and CBGs as ordered, Perform/monitor CIWA, COWS, AIMS and Fall Risk screenings as ordered, Perform wound care treatments as ordered.  Evaluation of Outcomes: Adequate for Discharge   LCSW Treatment Plan for Primary Diagnosis: Severe major depression without psychotic features (HCC) Long Term Goal(s): Safe transition to appropriate next level of care at discharge, Engage patient in therapeutic group addressing interpersonal concerns.  Short Term Goals: Engage patient in aftercare planning with referrals and resources, Identify triggers associated with mental health/substance abuse issues and Increase skills for wellness and recovery  Therapeutic Interventions: Assess for all discharge needs, 1 to 1 time with Social worker, Explore available resources and support systems, Assess for adequacy in community support network, Educate family and significant other(s) on suicide prevention, Complete Psychosocial Assessment, Interpersonal  group therapy.  Evaluation of Outcomes: Adequate for Discharge   Progress in Treatment: Attending groups: Yes Participating in groups: Yes Taking medication as prescribed: Yes, MD continues to assess for medication changes as needed Toleration medication: Yes, no side effects reported at this time Family/Significant other contact made: Yes with mother Patient understands diagnosis: Developing insight Discussing patient identified problems/goals with staff: Yes Medical problems stabilized or resolved: Yes Denies suicidal/homicidal ideation: Yes Issues/concerns per patient self-inventory: None Other: N/A  New problem(s) identified: None identified at this time.   New Short Term/Long Term Goal(s): None identified at this time.   Discharge Plan or Barriers: Pt will return home and follow-up with outpatient services.   Reason for Continuation of Hospitalization: None identified at this time.   Estimated Length of Stay: 0 days  Attendees: Patient: 09/24/2016  8:41 AM  Physician: Dr. Elna BreslowEappen, Dr. Mckinley Jewelates 09/24/2016  8:41 AM  Nursing: Quintella ReichertBeverly Knight, Marzetta Boardhrista Dopson, RN 09/24/2016  8:41 AM  RN Care Manager: Onnie BoerJennifer Clark, RN 09/24/2016  8:41 AM  Social Worker: Vernie ShanksLauren Ell Tiso, LCSW; Belenda CruiseKristin Drinkard, LCSW 09/24/2016  8:41 AM  Recreational Therapist:  09/24/2016  8:41 AM  Other: Armandina StammerAgnes Nwoko, NP; Gray BernhardtMay Augustin, NP; Hillery Jacksanika Lewis, NP 09/24/2016  8:41 AM  Other:  09/24/2016  8:41 AM  Other: 09/24/2016  8:41 AM    Scribe for Treatment Team: Verdene LennertLauren C Susanna Benge, LCSW 09/24/2016 8:41 AM

## 2016-09-24 NOTE — Progress Notes (Signed)
  [facility] :  Will you be returning to the same living situation after discharge:  Yes,  Pt returning to campus housing At discharge, do you have transportation home?: Yes,  Pt friend to pick up Do you have the ability to pay for your medications: Yes,  Pt provided with prescriptions  Release of information consent forms completed and in the chart;  Patient's signature needed at discharge.  Patient to Follow up at: Follow-up Information    [facility]   Why:  12/5 at 4:00pm [facility]. 12/6 at 11:30am for your follow-up with the counseling center. 12/15 at 1:00pm for medication management with Bertis RuddyMegan Blankman, NP Contact information: [facility] [address] [place] [address]       [facility].   Why:  at 4:40pm with Freddrick MarchLeeland Wingfield for therapy. Contact information: [address] [place], [place] [address] Tel: [phone] Fax: [phone]          Next level of care provider [facility] Link:no  Safety Planning and Suicide Prevention discussed: Yes,  with mother; see SPE  Have you used any form of tobacco in the last 30 days? (Cigarettes, Smokeless Tobacco, Cigars, and/or Pipes): No  Has patient been referred to the Quitline?: N/A patient is not a smoker  Patient has been referred for addiction treatment: Yes  Verdene LennertLauren C Katheen Aslin 09/24/2016, 8:42 AM

## 2016-09-24 NOTE — Progress Notes (Signed)
Adult Psychoeducational Group Note  Date:  09/24/2016 Time:  11:38 AM  Group Topic/Focus:  Coping With Mental Health Crisis:   The purpose of this group is to help patients identify strategies for coping with mental health crisis.  Group discusses possible causes of crisis and ways to manage them effectively.   Participation Level:  Active  Participation Quality:  Appropriate and Attentive  Affect:  Appropriate  Cognitive:  Alert and Appropriate  Insight: Appropriate, Good and Improving  Engagement in Group:  Engaged  Modes of Intervention:  Discussion  Additional Comments:  Pt did participate in group this morning.  Pt states that she is here for depression and that she sometimes cuts in order to deal with it.  Pt states that she also is being bullied in college.  Pt states that she feels better now than when she first came here and that she will be leaving today. Rhylen Shaheen R Jamy Cleckler 09/24/2016, 11:38 AM

## 2016-09-24 NOTE — Progress Notes (Signed)
Recreation Therapy Notes  Date: 09/24/16 Time: 0930 Location: [address] Dayroom  Group Topic: Stress Management  Goal Area(s) Addresses:  Patient will verbalize importance of using healthy stress management.  Patient will identify positive emotions associated with healthy stress management.   Intervention: Calm App  Activity :  Managing Stress Meditation.  LRT introduced the stress management technique of stress management.  LRT played a recorded meditation on stress and how to deal with it.  Patients were to follow along with the recording and focus on their breathing and concentration to participate in the technique.  Education:  Stress Management, Discharge Planning.   Education Outcome: Acknowledges edcuation/In group clarification offered/Needs additional education  Clinical Observations/Feedback: Pt did not attend group.     Tylor Courtwright, LRT/CTRS         Alisabeth Selkirk A 09/24/2016 11:58 AM 

## 2016-09-24 NOTE — Progress Notes (Signed)
Nursing Progress Note 7p-7a  D) Patient presents childlike and interaction with writer is superficial. Patient observed with visitors and interacting with peers in milieu. Patient continues to minimize issues. Patient reports having a good day and denies SI/HI/AVH or pain. Patient is pleasant and cooperative with no issues or concerns. Patient contracts for safety at this time. Patient takes scheduled medications as prescribed.  A) Emotional support and encouragement given. Patient on safety checks. Opportunities for questions or concerns presented to patient. Patient encouraged to continue to identify stressors and coping skills and work on plan of care.  R) Patient observed socializing with peers early in the shift and later is seen sleeping appropriately. Patient remains safe on the unit at this time. Will continue to monitor.

## 2016-09-24 NOTE — [facility] Suicide Risk Assessment (Signed)
[facility]   Principal Problem: Severe major depression without psychotic features [facility](HCC) Discharge Diagnoses:  Patient Active Problem List   Diagnosis Date Noted  . Generalized anxiety disorder [F41.1] 09/19/2016  . Severe major depression without psychotic features (HCC) [F32.2] 09/19/2016    Total Time spent with patient: 30 minutes  Musculoskeletal: Strength & Muscle Tone: within normal limits Gait & Station: normal Patient leans: N/A  Psychiatric Specialty Exam: Review of Systems  Psychiatric/Behavioral: Negative for depression and suicidal ideas. The patient is not nervous/anxious.   All other systems reviewed and are negative.   Blood pressure 114/81, pulse (!) 110, temperature 98.3 F (36.8 C), temperature source Oral, resp. rate 16, height 5' 4.75" (1.645 m), weight 87.3 kg (192 lb 8 oz).Body mass index is 32.28 kg/m.  General Appearance: Casual  Eye Contact::  Good  Speech:  Clear and Coherent409  Volume:  Normal  Mood:  Euthymic  Affect:  Appropriate  Thought Process:  Goal Directed and Descriptions of Associations: Intact  Orientation:  Full (Time, Place, and Person)  Thought Content:  Logical  Suicidal Thoughts:  No  Homicidal Thoughts:  No  Memory:  Immediate;   Fair Recent;   Fair Remote;   Fair  Judgement:  Fair  Insight:  Fair  Psychomotor Activity:  Normal  Concentration:  Fair  Recall:  FiservFair  Fund of Knowledge:Fair  Language: Fair  Akathisia:  No  Handed:  Right  AIMS (if indicated):     Assets:  Desire for Improvement  Sleep:  Number of Hours: 5  Cognition: WNL  ADL's:  Intact   Mental Status Per Nursing Assessment::   On Admission:     Demographic Factors:  Adolescent or young adult  Loss Factors: NA  Historical Factors: Impulsivity  Risk Reduction Factors:   Positive social support and Positive therapeutic relationship  Continued Clinical Symptoms:  Depression: continue medications  Cognitive  Features That Contribute To Risk:  Closed-mindedness, Polarized thinking and Thought constriction (tunnel vision)    Suicide Risk:  Minimal: No identifiable suicidal ideation.  Patients presenting with no risk factors but with morbid ruminations; may be classified as minimal risk based on the severity of the depressive symptoms  Follow-up Information    [facility].   Why:  12/5 at 4:00pm [facility]. 12/6 at 11:30am for your follow-up with the counseling center. 12/15 at 1:00pm for medication management with Bertis RuddyMegan Blankman, NP Contact information: [facility] [address] [place] [address]       [facility] on 10[facility]01/2016.   Why:  at 4:40pm with Freddrick MarchLeeland Wingfield for therapy. Contact information: [address] [place], [place] [address] Tel: [phone] Fax: [phone]          Plan Of Care/Follow-up recommendations:  Activity:  no restrictions Diet:  regular Other:  Follow up with PMD for EKG changes - needs to follow up.  Roanna Reaves, MD 09/24/2016, 9:30 AM

## 2018-03-14 ENCOUNTER — Encounter (HOSPITAL_COMMUNITY): Payer: Self-pay | Admitting: Emergency Medicine

## 2018-03-14 ENCOUNTER — Emergency Department (HOSPITAL_COMMUNITY)
Admission: EM | Admit: 2018-03-14 | Discharge: 2018-03-15 | Disposition: A | Discharge status: Home / Self Care | Attending: Emergency Medicine | Admitting: Emergency Medicine

## 2018-03-14 ENCOUNTER — Other Ambulatory Visit: Payer: Self-pay

## 2018-03-14 DIAGNOSIS — R1011 Right upper quadrant pain: Secondary | ICD-10-CM | POA: Diagnosis not present

## 2018-03-14 DIAGNOSIS — R197 Diarrhea, unspecified: Secondary | ICD-10-CM | POA: Diagnosis not present

## 2018-03-14 DIAGNOSIS — Z79899 Other long term (current) drug therapy: Secondary | ICD-10-CM | POA: Insufficient documentation

## 2018-03-14 DIAGNOSIS — R112 Nausea with vomiting, unspecified: Secondary | ICD-10-CM | POA: Diagnosis not present

## 2018-03-14 DIAGNOSIS — R1013 Epigastric pain: Secondary | ICD-10-CM

## 2018-03-14 HISTORY — DX: Major depressive disorder, single episode, unspecified: F32.9

## 2018-03-14 HISTORY — DX: Depression, unspecified: F32.A

## 2018-03-14 HISTORY — DX: Anxiety disorder, unspecified: F41.9

## 2018-03-14 NOTE — ED Triage Notes (Signed)
C/o constant, sharp, upper abd pain with nausea, vomiting (x2), and diarrhea (multiple times) since 11am.  Denies urinary complaint.

## 2018-03-15 ENCOUNTER — Emergency Department (HOSPITAL_COMMUNITY)

## 2018-03-15 LAB — COMPREHENSIVE METABOLIC PANEL
ALBUMIN: 4.5 g/dL (ref 3.5–5.0)
ALT: 28 U/L (ref 14–54)
AST: 29 U/L (ref 15–41)
Alkaline Phosphatase: 98 U/L (ref 38–126)
Anion gap: 13 (ref 5–15)
BUN: 8 mg/dL (ref 6–20)
CO2: 23 mmol/L (ref 22–32)
Calcium: 9.8 mg/dL (ref 8.9–10.3)
Chloride: 105 mmol/L (ref 101–111)
Creatinine, Ser: 0.78 mg/dL (ref 0.44–1.00)
GFR calc Af Amer: >60 mL/min (ref >60)
GFR calc non Af Amer: >60 mL/min (ref >60)
GLUCOSE: 103 mg/dL — AB (ref 65–99)
POTASSIUM: 4 mmol/L (ref 3.5–5.1)
SODIUM: 141 mmol/L (ref 135–145)
Total Bilirubin: 0.9 mg/dL (ref 0.3–1.2)
Total Protein: 8 g/dL (ref 6.5–8.1)

## 2018-03-15 LAB — URINALYSIS, ROUTINE W REFLEX MICROSCOPIC
GLUCOSE, UA: NEGATIVE mg/dL
Ketones, ur: >80 mg/dL — AB
Leukocytes, UA: NEGATIVE
Nitrite: NEGATIVE
PH: 6 (ref 5.0–8.0)
Protein, ur: NEGATIVE mg/dL

## 2018-03-15 LAB — URINALYSIS, MICROSCOPIC (REFLEX)

## 2018-03-15 LAB — CBC
HEMATOCRIT: 46.3 % — AB (ref 36.0–46.0)
HEMOGLOBIN: 15.7 g/dL — AB (ref 12.0–15.0)
MCH: 31.7 pg (ref 26.0–34.0)
MCHC: 33.9 g/dL (ref 30.0–36.0)
MCV: 93.5 fL (ref 78.0–100.0)
Platelets: 239 10*3/uL (ref 150–400)
RBC: 4.95 MIL/uL (ref 3.87–5.11)
RDW: 13.1 % (ref 11.5–15.5)
WBC: 19.7 10*3/uL — ABNORMAL HIGH (ref 4.0–10.5)

## 2018-03-15 LAB — I-STAT BETA HCG BLOOD, ED (MC, WL, AP ONLY): I-stat hCG, quantitative: <5 m[IU]/mL (ref <5)

## 2018-03-15 LAB — LIPASE, BLOOD: Lipase: 20 U/L (ref 11–51)

## 2018-03-15 MED ORDER — SUCRALFATE 1 GM/10ML PO SUSP: 1.0000 g | Freq: Three times a day (TID) | ORAL | 0 refills | Status: AC

## 2018-03-15 MED ORDER — SODIUM CHLORIDE 0.9 % IV BOLUS
1000.0000 mL | Freq: Once | INTRAVENOUS | Status: AC
  Administered 2018-03-15: 1000 mL via INTRAVENOUS

## 2018-03-15 MED ORDER — ONDANSETRON HCL 4 MG/2ML IJ SOLN
4.0000 mg | Freq: Once | INTRAMUSCULAR | Status: AC
  Administered 2018-03-15: 4 mg via INTRAVENOUS
  Filled 2018-03-15: qty 2

## 2018-03-15 MED ORDER — KETOROLAC TROMETHAMINE 30 MG/ML IJ SOLN
30.0000 mg | Freq: Once | INTRAMUSCULAR | Status: AC
  Administered 2018-03-15: 30 mg via INTRAVENOUS
  Filled 2018-03-15: qty 1

## 2018-03-15 MED ORDER — FAMOTIDINE 20 MG PO TABS: 20.0000 mg | ORAL_TABLET | Freq: Two times a day (BID) | ORAL | 0 refills | Status: AC

## 2018-03-15 MED ORDER — FAMOTIDINE IN NACL 20-0.9 MG/50ML-% IV SOLN
20.0000 mg | Freq: Once | INTRAVENOUS | Status: AC
  Administered 2018-03-15: 20 mg via INTRAVENOUS
  Filled 2018-03-15: qty 50

## 2018-03-15 MED ORDER — ONDANSETRON HCL 4 MG PO TABS: 4.0000 mg | ORAL_TABLET | Freq: Four times a day (QID) | ORAL | 0 refills | Status: AC

## 2018-03-15 NOTE — Discharge Instructions (Signed)
Medications: Zofran, Pepcid, Carafate  Treatment: Take Zofran every 6 hours as needed for nausea or vomiting.  Take Pepcid twice daily.  Take Carafate before eating and before bed to help prevent stomach irritation.  Follow-up: Please follow-up with gastroenterology if your symptoms are not improving.  Please return to the emergency department if you develop any new or worsening symptoms.

## 2018-03-15 NOTE — ED Notes (Signed)
Pt still unable to urinate 

## 2018-03-15 NOTE — ED Notes (Signed)
Pt unable to give urine specimen at this time 

## 2018-03-15 NOTE — ED Notes (Signed)
Pt tolerating drinking water

## 2018-03-15 NOTE — ED Notes (Signed)
Tech unable to get labs.  Phlebotomy called.

## 2018-03-15 NOTE — ED Provider Notes (Signed)
[facility] EMERGENCY DEPARTMENT Provider Note   CSN: [id number] Arrival date & time: 03/14/18  2244     History   Chief Complaint Chief Complaint  Patient presents with  . Abdominal Pain    HPI Carrie Fischer is a 22 y.o. female with history of anxiety, depression with a 1 day history of abdominal pain, nausea, vomiting, and diarrhea.  Patient reports her pain began around 24 hours ago.  She has had a sharp stabbing pain in her upper abdomen that has been constant.  It radiates to her mid-back. She has had diarrhea every few minutes that is nonbloody.  She has had a few episodes of nausea and vomiting.  She reports eating [employer] for the first time over 24 hours prior to symptoms, however denies any other new or strange foods.  She denies any recent travel out of the country or antibiotic use.  She did not take any medications prior to arrival.  She is not able to tolerate fluids without.  She denies any shortness of breath, but does have some burning in her chest.  She denies any urinary symptoms, abnormal vaginal discharge or bleeding.  She reports that someone else in her dorm was vomiting, however no other known sick contacts.  Patient does endorse taking NSAIDs fairly regularly.  She thinks she also has a history of an ulcer.  HPI  Past Medical History:  Diagnosis Date  . Anxiety   . Depression   . Medical history non-contributory     Patient Active Problem List   Diagnosis Date Noted  . Generalized anxiety disorder 09/19/2016  . 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 No pertinent surgical history.   OB History   None      Home Medications    Prior to Admission medications   Medication Sig Start Date End Date Taking? Authorizing Provider  bacitracin ointment Apply topically as needed for wound care. 09/24/16   Oneta Rack, NP  famotidine (PEPCID) 20 MG tablet Take 1 tablet (20 mg total) by mouth 2  (two) times daily. 03/15/18   Elveria Lauderbaugh, Waylan Boga, PA-C  FLUoxetine (PROZAC) 20 MG capsule Take 1 capsule (20 mg total) by mouth daily. 09/25/16   Oneta Rack, NP  hydrOXYzine (ATARAX/VISTARIL) 50 MG tablet Take 1 tablet (50 mg total) by mouth 2 (two) times daily as needed for anxiety. 09/24/16   Adonis Brook, NP  ondansetron (ZOFRAN) 4 MG tablet Take 1 tablet (4 mg total) by mouth every 6 (six) hours. 03/15/18   Mazie Fencl, Waylan Boga, PA-C  sucralfate (CARAFATE) 1 GM/10ML suspension Take 10 mLs (1 g total) by mouth 4 (four) times daily -  with meals and at bedtime. 03/15/18   Wael Maestas, Waylan Boga, PA-C  traZODone (DESYREL) 50 MG tablet Take 1 tablet (50 mg total) by mouth at bedtime and may repeat dose one time if needed. 09/24/16   Oneta Rack, NP    Family History No family history on file.  Social History Social History   Tobacco Use  . Smoking status: Never Smoker  . Smokeless tobacco: Never Used  Substance Use Topics  . Alcohol use: No    Comment: denies  . Drug use: No    Comment: denies     Allergies   Patient has no known allergies.   Review of Systems Review of Systems  Constitutional: Negative for chills and fever.  HENT: Negative for facial swelling and sore throat.  Respiratory: Negative for shortness of breath.   Cardiovascular: Positive for chest pain (burning).  Gastrointestinal: Positive for abdominal pain, diarrhea, nausea and vomiting. Negative for blood in stool.  Genitourinary: Negative for dysuria, frequency, vaginal bleeding and vaginal discharge.  Musculoskeletal: Negative for back pain.  Skin: Negative for rash and wound.  Neurological: Negative for headaches.  Psychiatric/Behavioral: The patient is not nervous/anxious.      Physical Exam Updated Vital Signs BP 114/75   Pulse (!) 114   Temp 98.5 F (36.9 C) (Oral)   Resp 16   LMP 02/21/2018   SpO2 99%   Physical Exam  Constitutional: She appears well-developed and well-nourished. No  distress.  HENT:  Head: Normocephalic and atraumatic.  Mouth/Throat: Oropharynx is clear and moist. No oropharyngeal exudate.  Eyes: Pupils are equal, round, and reactive to light. Conjunctivae are normal. Right eye exhibits no discharge. Left eye exhibits no discharge. No scleral icterus.  Neck: Normal range of motion. Neck supple. No thyromegaly present.  Cardiovascular: Normal rate, regular rhythm, normal heart sounds and intact distal pulses. Exam reveals no gallop and no friction rub.  No murmur heard. Pulmonary/Chest: Effort normal and breath sounds normal. No stridor. No respiratory distress. She has no wheezes. She has no rales.  Abdominal: Soft. Bowel sounds are normal. She exhibits no distension. There is tenderness in the right upper quadrant, epigastric area and periumbilical area. There is positive Murphy's sign. There is no rebound, no guarding and no CVA tenderness.  Musculoskeletal: She exhibits no edema.  Lymphadenopathy:    She has no cervical adenopathy.  Neurological: She is alert. Coordination normal.  Skin: Skin is warm and dry. No rash noted. She is not diaphoretic. No pallor.  Psychiatric: She has a normal mood and affect.  Nursing note and vitals reviewed.    ED Treatments / Results  Labs (all labs ordered are listed, but only abnormal results are displayed) Labs Reviewed  COMPREHENSIVE METABOLIC PANEL - Abnormal; Notable for the following components:      Result Value   Glucose, Bld 103 (*)    All other components within normal limits  CBC - Abnormal; Notable for the following components:   WBC 19.7 (*)    Hemoglobin 15.7 (*)    HCT 46.3 (*)    All other components within normal limits  URINALYSIS, ROUTINE W REFLEX MICROSCOPIC - Abnormal; Notable for the following components:   APPearance HAZY (*)    Specific Gravity, Urine >1.030 (*)    Hgb urine dipstick TRACE (*)    Bilirubin Urine SMALL (*)    Ketones, ur >80 (*)    All other components within  normal limits  URINALYSIS, MICROSCOPIC (REFLEX) - Abnormal; Notable for the following components:   Bacteria, UA FEW (*)    All other components within normal limits  LIPASE, BLOOD  I-STAT BETA HCG BLOOD, ED ([facility], [facility], [facility] ONLY)    EKG EKG Interpretation  Date/Time:  Friday Mar 14 2018 23:26:49 EDT Ventricular Rate:  124 PR Interval:  126 QRS Duration: 72 QT Interval:  308 QTC Calculation: 442 R Axis:   75 Text Interpretation:  Sinus tachycardia Otherwise normal ECG When compared with ECG of 09/23/2016, HEART RATE has increased Arm-leg lead reversal has been corrected Confirmed by Dione Booze ([id number]) on 03/15/2018 1:26:20 AM   Radiology US Abdomen Limited Ruq  Result Date: 03/15/2018 CLINICAL DATA:  RIGHT upper quadrant and epigastric abdominal pain with nausea, vomiting and diarrhea since yesterday EXAM: ULTRASOUND ABDOMEN LIMITED RIGHT UPPER  QUADRANT COMPARISON:  None FINDINGS: Gallbladder: Normally distended without stones or wall thickening. No pericholecystic fluid or sonographic Murphy sign. Common bile duct: Diameter: 4 mm diameter, normal Liver: Slightly coarsened upper normal echogenicity. No focal hepatic mass or nodularity identified, though assessment of intrahepatic detail is somewhat limited secondary to body habitus. Portal vein patent on color Doppler imaging with normal direction of blood flow towards the liver. No RIGHT upper quadrant free fluid. IMPRESSION: No acute abnormalities. Electronically Signed   By: Ulyses Southward M.D.   On: 03/15/2018 12:10    Procedures Procedures (including critical care time)  Medications Ordered in ED Medications  sodium chloride 0.9 % bolus 1,000 mL (0 mLs Intravenous Stopped 03/15/18 1014)  ondansetron (ZOFRAN) injection 4 mg (4 mg Intravenous Given 03/15/18 0913)  ketorolac (TORADOL) 30 MG/ML injection 30 mg (30 mg Intravenous Given 03/15/18 0913)  famotidine (PEPCID) IVPB 20 mg premix (0 mg Intravenous Stopped 03/15/18 0943)  sodium  chloride 0.9 % bolus 1,000 mL (0 mLs Intravenous Stopped 03/15/18 1454)  ondansetron (ZOFRAN) injection 4 mg (4 mg Intravenous Given 03/15/18 1454)     Initial Impression / Assessment and Plan / ED Course  I have reviewed the triage vital signs and the nursing notes.  Pertinent labs & imaging results that were available during my care of the patient were reviewed by me and considered in my medical decision making (see chart for details).  Clinical Course as of Mar 16 1507  Sat Mar 15, 2018  1119 On reexamination after Zofran, Toradol, Pepcid, 1500 mL fluid, patient states she is improving.  On repeat abdominal exam, patient still tender right upper quadrant, but improved elsewhere.  Ultrasound tech arriving to take patient to right upper quadrant ultrasound at this time.   [AL]    Clinical Course User Index [AL] Emi Holes, PA-C    Suspect viral gastroenteritis versus gastritis versus ulcer.  Patient with leukocytosis to 19.7.  CMP unremarkable.  Lipase within normal limits.  Right upper quadrant ultrasound is negative.  UA shows trace hematuria, small bilirubin, greater than 80 ketones.  Suspect significant dehydration related to patient's diarrhea.  Patient given 2 L of fluid and is feeling better after Zofran, Toradol, Pepcid.  Patient continues to have mild epigastric and right upper quadrant tenderness, however the remaining has resolved.  She is tolerating oral fluids prior to discharge and is ready to go home.  Will discharge home with Zofran, Pepcid, Carafate and refer to GI for further evaluation.  Patient advised to avoid NSAIDs.  Return precautions discussed.  Patient understands and agrees with plan.  Patient vitals stable throughout ED course and discharged in satisfactory condition. I discussed patient case with Dr. Particia Nearing who agrees with plan.   Final Clinical Impressions(s) / ED Diagnoses   Final diagnoses:  Epigastric abdominal pain  RUQ pain  Nausea vomiting and  diarrhea    ED Discharge Orders        Ordered    famotidine (PEPCID) 20 MG tablet  2 times daily     03/15/18 1447    sucralfate (CARAFATE) 1 GM/10ML suspension  3 times daily with meals & bedtime     03/15/18 1447    ondansetron (ZOFRAN) 4 MG tablet  Every 6 hours     03/15/18 1447       LawWaylan Boga, PA-C 03/15/18 1508    Jacalyn Lefevre, MD 03/15/18 1520

## 2019-12-24 IMAGING — US US ABDOMEN LIMITED
1 series · 14 of 25 positions shown · non-contrast
Comparison: None

CLINICAL DATA: RIGHT upper quadrant and epigastric abdominal pain
with nausea, vomiting and diarrhea since yesterday

EXAM:
ULTRASOUND ABDOMEN LIMITED RIGHT UPPER QUADRANT

[Series 1: us abdomen limited · 0.22mm/px · 14 of 26 slices shown]
[im 1/26]
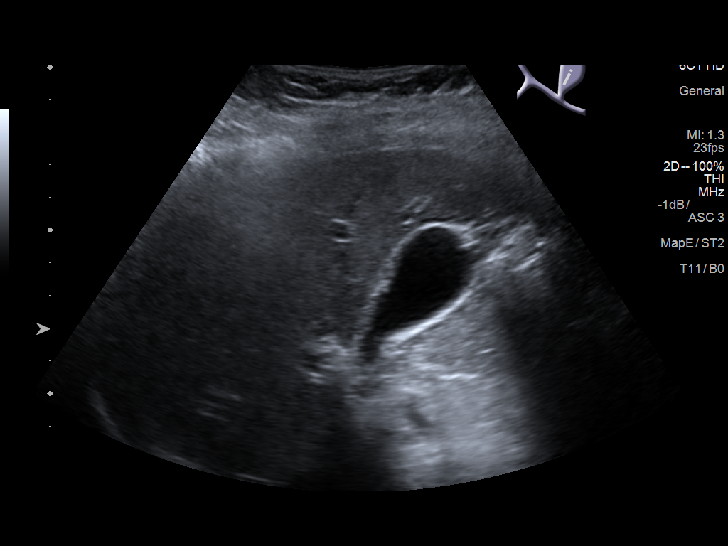
[im 3/26]
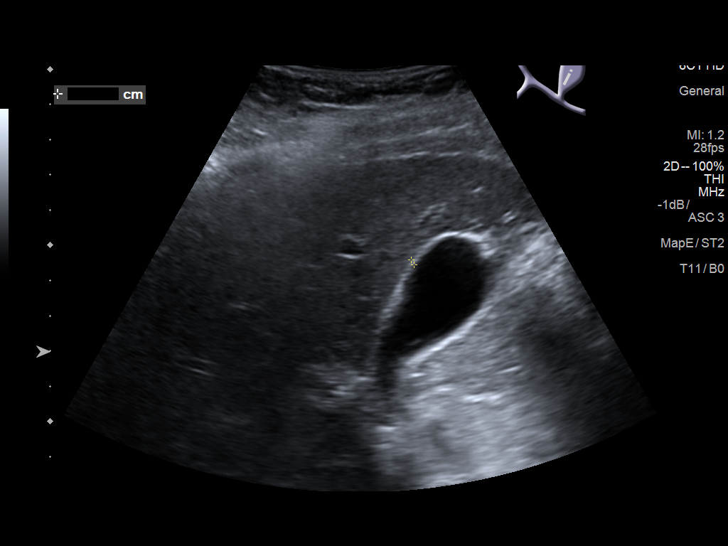
[im 5/26]
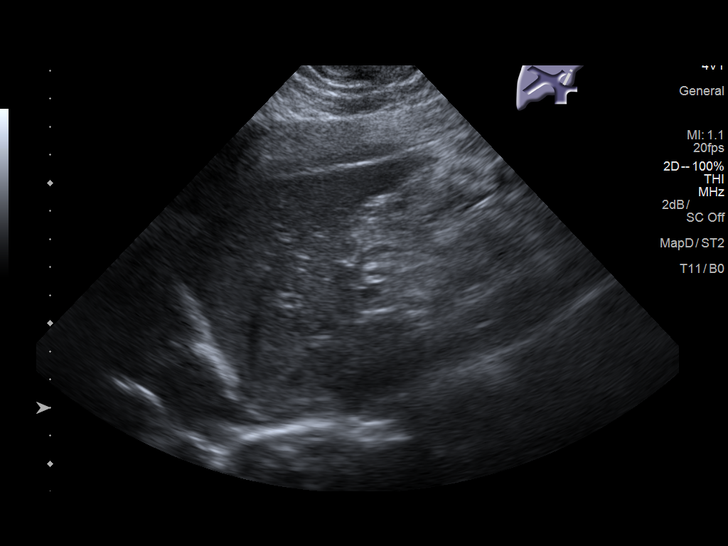
[im 7/26]
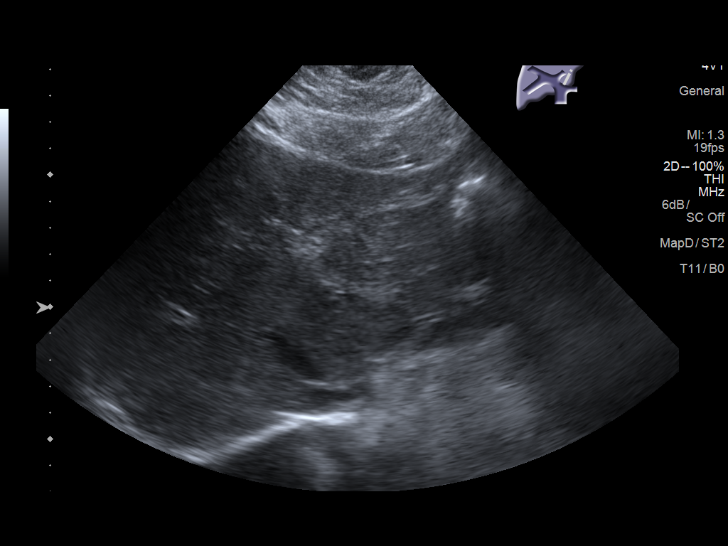
[im 9/26]
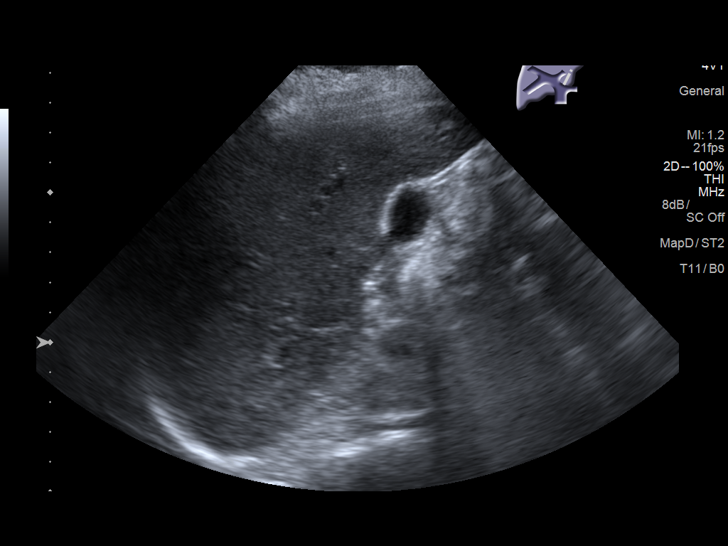
[im 10/26]
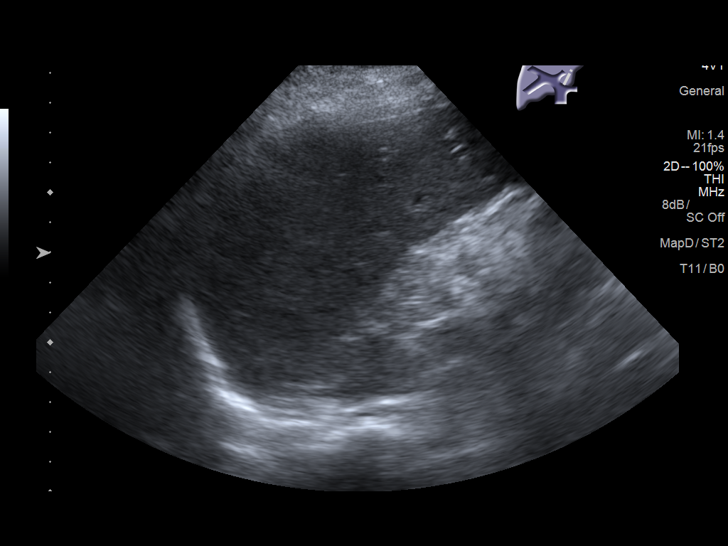
[im 12/26]
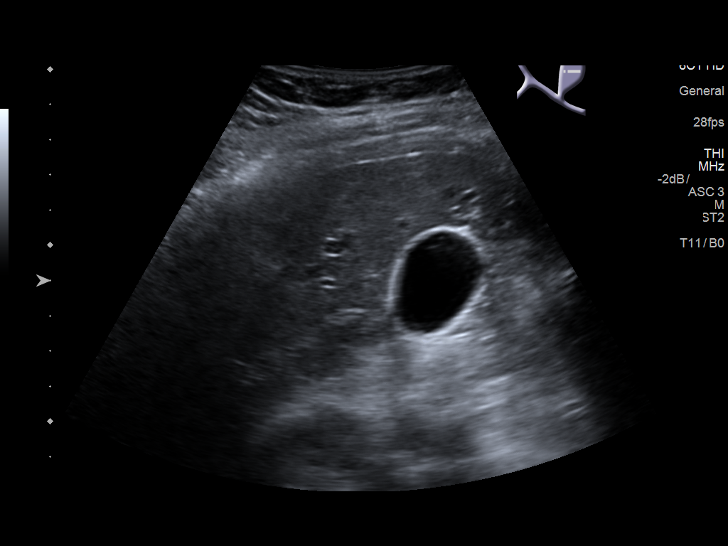
[im 14/26]
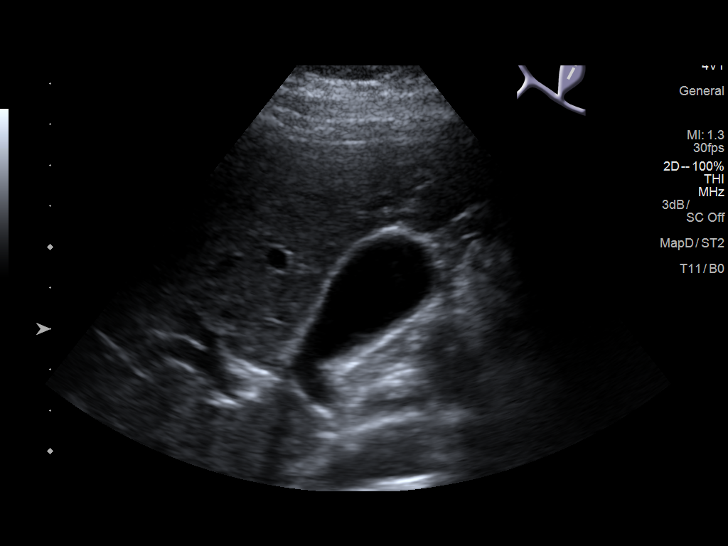
[im 16/26]
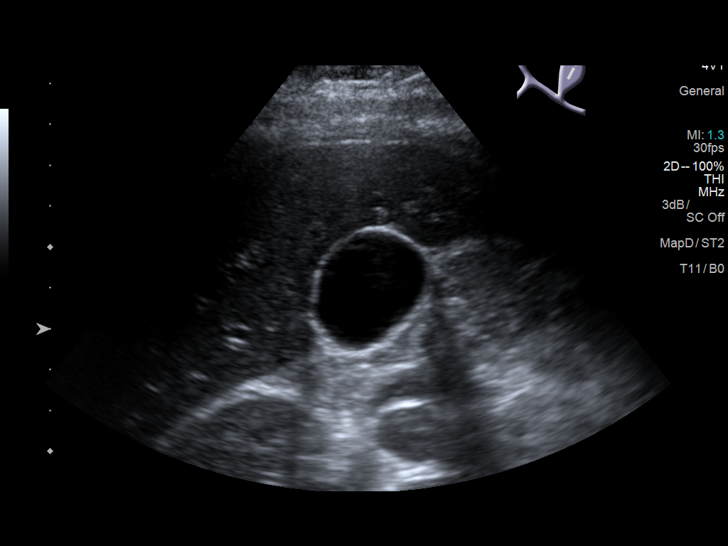
[im 17/26]
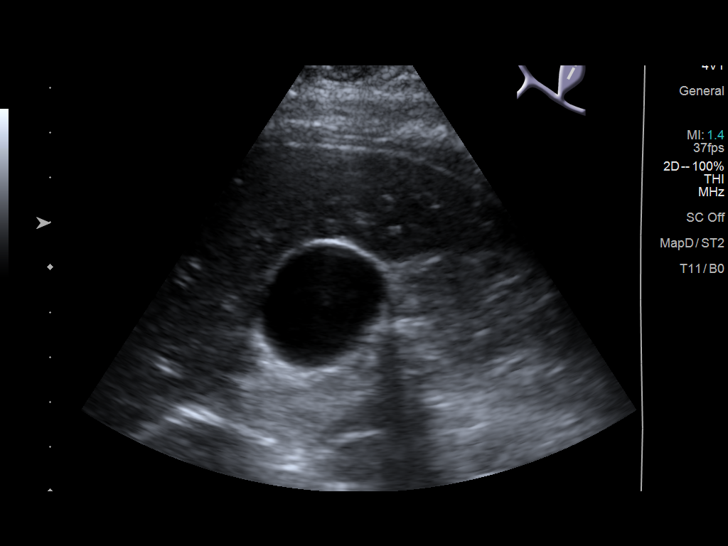
[im 19/26]
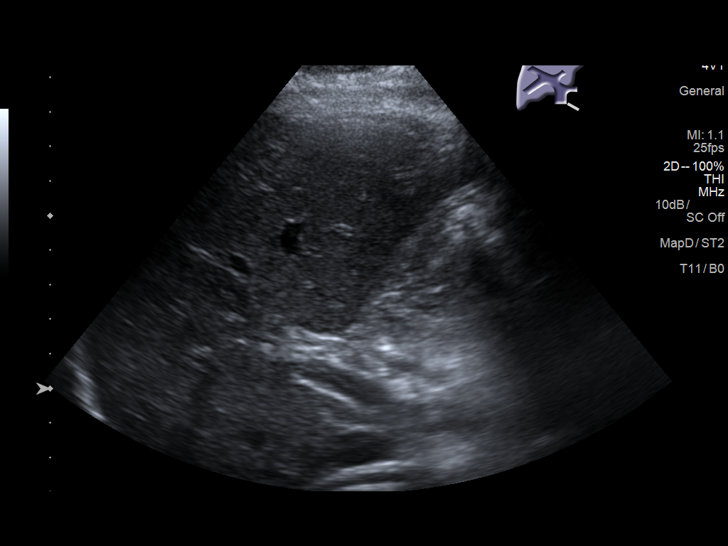
[im 21/26]
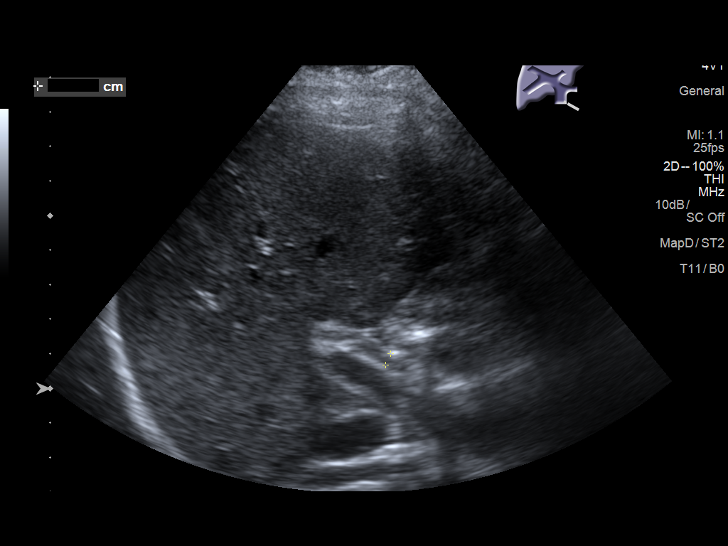
[im 23/26]
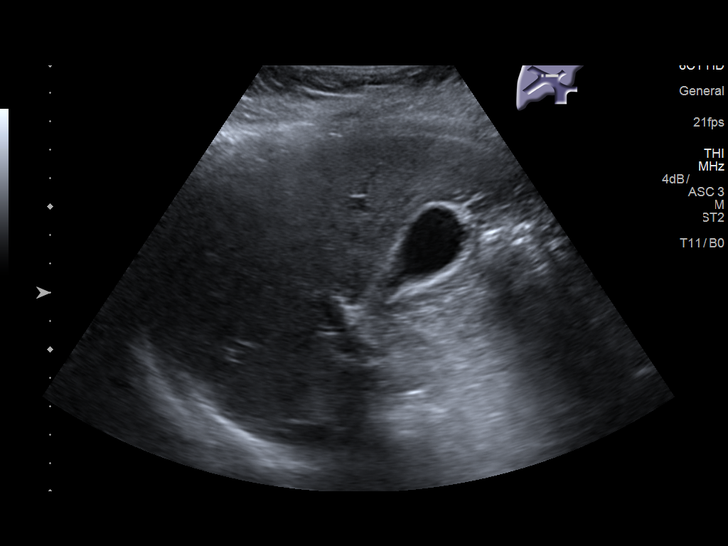
[im 26/26]
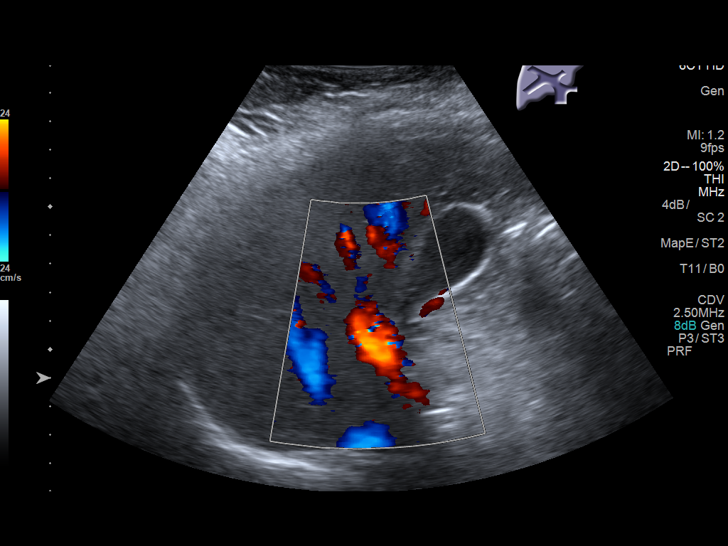

[14 of 25 positions shown; findings below may reference images not displayed]

FINDINGS: Gallbladder:

Normally distended without stones or wall thickening. No
pericholecystic fluid or sonographic Murphy sign.

Common bile duct:

Diameter: 4 mm diameter, normal

Liver:

Slightly coarsened upper normal echogenicity. No focal hepatic mass
or nodularity identified, though assessment of intrahepatic detail
is somewhat limited secondary to body habitus. Portal vein patent on
color Doppler imaging with normal direction of blood flow towards
the liver.

No RIGHT upper quadrant free fluid.
IMPRESSION: No acute abnormalities.
